# Patient Record
Sex: Male | Born: 1970 | Hispanic: No | Marital: Married | Smoking: Never smoker
Health system: Southern US, Community
[De-identification: ages and names within clinical notes are randomized; demographics above are authoritative.]

## PROBLEM LIST (undated history)

## (undated) ENCOUNTER — Emergency Department (HOSPITAL_COMMUNITY): Disposition: A | Discharge status: Home / Self Care | Payer: Self-pay

## (undated) DIAGNOSIS — I10 Essential (primary) hypertension: Secondary | ICD-10-CM

## (undated) DIAGNOSIS — K920 Hematemesis: Secondary | ICD-10-CM

## (undated) DIAGNOSIS — F1020 Alcohol dependence, uncomplicated: Secondary | ICD-10-CM

---

## 1999-11-07 ENCOUNTER — Emergency Department (HOSPITAL_COMMUNITY): Admission: EM | Admit: 1999-11-07 | Discharge: 1999-11-07 | Payer: Self-pay | Admitting: *Deleted

## 2004-09-20 ENCOUNTER — Emergency Department (HOSPITAL_COMMUNITY): Admission: EM | Admit: 2004-09-20 | Discharge: 2004-09-21 | Payer: Self-pay | Admitting: Emergency Medicine

## 2017-08-22 ENCOUNTER — Emergency Department (HOSPITAL_COMMUNITY): Payer: Self-pay

## 2017-08-22 ENCOUNTER — Emergency Department (HOSPITAL_COMMUNITY)
Admission: EM | Admit: 2017-08-22 | Discharge: 2017-08-22 | Disposition: A | Discharge status: Home / Self Care | Payer: Self-pay | Attending: Emergency Medicine | Admitting: Emergency Medicine

## 2017-08-22 ENCOUNTER — Encounter (HOSPITAL_COMMUNITY): Payer: Self-pay | Admitting: Emergency Medicine

## 2017-08-22 DIAGNOSIS — F1010 Alcohol abuse, uncomplicated: Secondary | ICD-10-CM | POA: Insufficient documentation

## 2017-08-22 HISTORY — DX: Alcohol dependence, uncomplicated: F10.20

## 2017-08-22 HISTORY — DX: Hematemesis: K92.0

## 2017-08-22 LAB — COMPREHENSIVE METABOLIC PANEL
ALT: 34 U/L (ref 17–63)
AST: 34 U/L (ref 15–41)
Albumin: 4.3 g/dL (ref 3.5–5.0)
Alkaline Phosphatase: 67 U/L (ref 38–126)
Anion gap: 10 (ref 5–15)
BUN: 22 mg/dL — ABNORMAL HIGH (ref 6–20)
CO2: 23 mmol/L (ref 22–32)
Calcium: 8.5 mg/dL — ABNORMAL LOW (ref 8.9–10.3)
Chloride: 107 mmol/L (ref 101–111)
Creatinine, Ser: 0.91 mg/dL (ref 0.61–1.24)
GFR calc Af Amer: >60 mL/min (ref >60)
GFR calc non Af Amer: >60 mL/min (ref >60)
Glucose, Bld: 121 mg/dL — ABNORMAL HIGH (ref 65–99)
Potassium: 3.3 mmol/L — ABNORMAL LOW (ref 3.5–5.1)
Sodium: 140 mmol/L (ref 135–145)
Total Bilirubin: 0.6 mg/dL (ref 0.3–1.2)
Total Protein: 7.5 g/dL (ref 6.5–8.1)

## 2017-08-22 LAB — TYPE AND SCREEN
ABO/RH(D): A POS
Antibody Screen: NEGATIVE

## 2017-08-22 LAB — CBC WITH DIFFERENTIAL/PLATELET
Basophils Absolute: 0 10*3/uL (ref 0.0–0.1)
Basophils Relative: 0 %
Eosinophils Absolute: 0.2 10*3/uL (ref 0.0–0.7)
Eosinophils Relative: 1 %
HCT: 43.3 % (ref 39.0–52.0)
Hemoglobin: 15.3 g/dL (ref 13.0–17.0)
Lymphocytes Relative: 15 %
Lymphs Abs: 2.6 10*3/uL (ref 0.7–4.0)
MCH: 32.5 pg (ref 26.0–34.0)
MCHC: 35.3 g/dL (ref 30.0–36.0)
MCV: 91.9 fL (ref 78.0–100.0)
Monocytes Absolute: 0.8 10*3/uL (ref 0.1–1.0)
Monocytes Relative: 5 %
Neutro Abs: 13.1 10*3/uL — ABNORMAL HIGH (ref 1.7–7.7)
Neutrophils Relative %: 79 %
Platelets: 175 10*3/uL (ref 150–400)
RBC: 4.71 MIL/uL (ref 4.22–5.81)
RDW: 13.4 % (ref 11.5–15.5)
WBC: 16.7 10*3/uL — ABNORMAL HIGH (ref 4.0–10.5)

## 2017-08-22 LAB — POC OCCULT BLOOD, ED: Fecal Occult Bld: NEGATIVE

## 2017-08-22 LAB — ETHANOL: Alcohol, Ethyl (B): 300 mg/dL — ABNORMAL HIGH (ref <10)

## 2017-08-22 LAB — ACETAMINOPHEN LEVEL: Acetaminophen (Tylenol), Serum: <10 ug/mL — ABNORMAL LOW (ref 10–30)

## 2017-08-22 LAB — ABO/RH: ABO/RH(D): A POS

## 2017-08-22 LAB — SALICYLATE LEVEL: Salicylate Lvl: <7 mg/dL (ref 2.8–30.0)

## 2017-08-22 MED ORDER — ONDANSETRON HCL 4 MG/2ML IJ SOLN
4.0000 mg | Freq: Once | INTRAMUSCULAR | Status: AC
  Administered 2017-08-22: 4 mg via INTRAVENOUS
  Filled 2017-08-22: qty 2

## 2017-08-22 MED ORDER — HALOPERIDOL LACTATE 5 MG/ML IJ SOLN
2.0000 mg | Freq: Once | INTRAMUSCULAR | Status: AC
  Administered 2017-08-22: 2 mg via INTRAVENOUS
  Filled 2017-08-22: qty 1

## 2017-08-22 MED ORDER — FAMOTIDINE 20 MG PO TABS
20.0000 mg | ORAL_TABLET | Freq: Two times a day (BID) | ORAL | 0 refills | Status: DC
Start: 2017-08-22 — End: 2022-04-17

## 2017-08-22 MED ORDER — HALOPERIDOL LACTATE 5 MG/ML IJ SOLN
2.0000 mg | Freq: Once | INTRAMUSCULAR | Status: AC
  Administered 2017-08-22: 2 mg via INTRAVENOUS

## 2017-08-22 MED ORDER — SODIUM CHLORIDE 0.9 % IV BOLUS (SEPSIS)
1000.0000 mL | Freq: Once | INTRAVENOUS | Status: AC
  Administered 2017-08-22: 1000 mL via INTRAVENOUS

## 2017-08-22 MED ORDER — HALOPERIDOL LACTATE 5 MG/ML IJ SOLN
INTRAMUSCULAR | Status: AC
  Filled 2017-08-22: qty 1

## 2017-08-22 NOTE — ED Triage Notes (Signed)
Pt BIB EMS from home with complaints of vomiting blood and being highly intoxicated. Family called out of concern for patient vomiting blood. Patient known to the ED for same complaints. Pt is combative and intoxicated on arrival.

## 2017-08-22 NOTE — ED Notes (Signed)
Bed: [facility]ALD Expected date:  Expected time:  Means of arrival:  Comments: 

## 2017-08-22 NOTE — ED Notes (Signed)
Bed: ZO10 Expected date:  Expected time:  Means of arrival:  Comments: 46 m vomiting blood

## 2017-08-22 NOTE — ED Provider Notes (Addendum)
Assumed care in sign out. Patient arrived highly intoxicated. Has a past history of alcohol abuse. Some hematemesis reported. No reported history of varices. He is not anticoagulated. H/H is normal. I suspect this may be a small Mallory-Weiss tear and not a significant GI bleed.is not tachycardic. Blood pressure soft, but he initially arrived hypertensive and lower recorded blood pressures were while sleeping/after sedation. He was ambulated prior to discharge with a steady gait. He has no further complaints. Daughter at bedside and to take home. Advised to abstain from etoh or, at least, drink in moderation.   It has been determined that no acute conditions requiring further emergency intervention are present at this time. The patient has been advised of the diagnosis and plan. I reviewed any labs and imaging including any potential incidental findings. We have discussed signs and symptoms that warrant return to the ED and they are listed in the discharge instructions.       Raeford Razor, MD 08/22/17 [phone]

## 2017-08-22 NOTE — ED Notes (Signed)
Pt was O2 sat was 88-90 pt would not tolerate a [place] and blow by was initiated and sats returned to 94-95%.

## 2017-08-22 NOTE — ED Notes (Signed)
Found patient wandering in the hallway looking for a restroom. Patient has steady gait and ambulated on his own. Patient pulled out IV, new access to be established.

## 2017-08-31 NOTE — ED Provider Notes (Signed)
[facility]-EMERGENCY DEPT Provider Note   CSN: [id number] Arrival date & time: 08/22/17  0219     History   Chief Complaint Chief Complaint  Patient presents with  . Hematemesis  . Alcohol Problem    HPI Walter Daniel is a 46 y.o. male.  The history is provided by the patient.  Alcohol Problem  This is a chronic problem. The current episode started more than 1 week ago. The problem occurs constantly. The problem has not changed since onset.Pertinent negatives include no chest pain, no abdominal pain, no headaches and no shortness of breath. Nothing aggravates the symptoms. Nothing relieves the symptoms. He has tried nothing for the symptoms. The treatment provided no relief.    Past Medical History:  Diagnosis Date  . Alcohol dependence (HCC)   . Hematemesis     There are no active problems to display for this patient.   No past surgical history on file.     Home Medications    Prior to Admission medications   Medication Sig Start Date End Date Taking? Authorizing Provider  famotidine (PEPCID) 20 MG tablet Take 1 tablet (20 mg total) by mouth 2 (two) times daily. 08/22/17   Epiphany Seltzer, MD    Family History No family history on file.  Social History Social History  Substance Use Topics  . Smoking status: Not on file  . Smokeless tobacco: Not on file  . Alcohol use Not on file     Allergies   Patient has no known allergies.   Review of Systems Review of Systems  Constitutional: Negative for fever.  Respiratory: Negative for shortness of breath.   Cardiovascular: Negative for chest pain.  Gastrointestinal: Negative for abdominal pain.  Neurological: Negative for headaches.  Psychiatric/Behavioral: Negative for self-injury, sleep disturbance and suicidal ideas.  All other systems reviewed and are negative.    Physical Exam Updated Vital Signs BP 101/66   Pulse 60   Resp 19   SpO2 98%   Physical Exam    Constitutional: He appears well-developed and well-nourished. No distress.  HENT:  Head: Normocephalic and atraumatic.  Mouth/Throat: No oropharyngeal exudate.  Eyes: Pupils are equal, round, and reactive to light. Conjunctivae are normal.  Neck: Normal range of motion. Neck supple.  Cardiovascular: Normal rate, regular rhythm, normal heart sounds and intact distal pulses.   Pulmonary/Chest: Effort normal and breath sounds normal.  Abdominal: Soft. Bowel sounds are normal. He exhibits no mass. There is no tenderness. There is no rebound and no guarding.  Musculoskeletal: Normal range of motion.  Neurological: He is alert. He displays normal reflexes.  Skin: Skin is warm. Capillary refill takes less than 2 seconds.  Psychiatric: He has a normal mood and affect.     ED Treatments / Results   Vitals:   08/22/17 0830 08/22/17 0900  BP: 107/78 101/66  Pulse: 65 60  Resp:  19  SpO2: 94% 98%    Labs (all labs ordered are listed, but only abnormal results are displayed)  Results for orders placed or performed during the hospital encounter of 08/22/17  CBC with Differential/Platelet  Result Value Ref Range   WBC 16.7 (H) 4.0 - 10.5 K/uL   RBC 4.71 4.22 - 5.81 MIL/uL   Hemoglobin 15.3 13.0 - 17.0 g/dL   HCT 32.443.3 40.139.0 - 02.752.0 %   MCV 91.9 78.0 - 100.0 fL   MCH 32.5 26.0 - 34.0 pg   MCHC 35.3 30.0 - 36.0 g/dL   RDW  13.4 11.5 - 15.5 %   Platelets 175 150 - 400 K/uL   Neutrophils Relative % 79 %   Neutro Abs 13.1 (H) 1.7 - 7.7 K/uL   Lymphocytes Relative 15 %   Lymphs Abs 2.6 0.7 - 4.0 K/uL   Monocytes Relative 5 %   Monocytes Absolute 0.8 0.1 - 1.0 K/uL   Eosinophils Relative 1 %   Eosinophils Absolute 0.2 0.0 - 0.7 K/uL   Basophils Relative 0 %   Basophils Absolute 0.0 0.0 - 0.1 K/uL  Comprehensive metabolic panel  Result Value Ref Range   Sodium 140 135 - 145 mmol/L   Potassium 3.3 (L) 3.5 - 5.1 mmol/L   Chloride 107 101 - 111 mmol/L   CO2 23 22 - 32 mmol/L   Glucose,  Bld 121 (H) 65 - 99 mg/dL   BUN 22 (H) 6 - 20 mg/dL   Creatinine, Ser 4.54 0.61 - 1.24 mg/dL   Calcium 8.5 (L) 8.9 - 10.3 mg/dL   Total Protein 7.5 6.5 - 8.1 g/dL   Albumin 4.3 3.5 - 5.0 g/dL   AST 34 15 - 41 U/L   ALT 34 17 - 63 U/L   Alkaline Phosphatase 67 38 - 126 U/L   Total Bilirubin 0.6 0.3 - 1.2 mg/dL   GFR calc non Af Amer >60 >60 mL/min   GFR calc Af Amer >60 >60 mL/min   Anion gap 10 5 - 15  Ethanol  Result Value Ref Range   Alcohol, Ethyl (B) 300 (H) <10 mg/dL  Salicylate level  Result Value Ref Range   Salicylate Lvl <7.0 2.8 - 30.0 mg/dL  Acetaminophen level  Result Value Ref Range   Acetaminophen (Tylenol), Serum <10 (L) 10 - 30 ug/mL  POC occult blood, ED  Result Value Ref Range   Fecal Occult Bld NEGATIVE NEGATIVE  Type and screen  Result Value Ref Range   ABO/RH(D) A POS    Antibody Screen NEG    Sample Expiration 08/25/2017   ABO/Rh  Result Value Ref Range   ABO/RH(D) A POS    Dg Abdomen Acute W/chest  Result Date: 08/22/2017 CLINICAL DATA:  Initial evaluation for acute hematemesis. EXAM: DG ABDOMEN ACUTE W/ 1V CHEST COMPARISON:  None. FINDINGS: Patient is markedly rotated to the right. Allowing for rotation, cardiac and mediastinal silhouettes are grossly within normal limits. Lungs normally inflated. No focal infiltrates. No pulmonary edema or pleural effusion. No appreciable pneumothorax on this supine radiograph. Bowel gas pattern within normal limits without obstruction or ileus. No abnormal bowel wall thickening. No appreciable free air on decubitus view. No soft tissue mass or abnormal calcification. Vascular phleboliths overlie the pelvis. No acute osseus abnormality. IMPRESSION: 1. Nonobstructive bowel gas pattern with no radiographic evidence for acute intra-abdominal pathology. 2. No active cardiopulmonary disease. Electronically Signed   By: Rise Mu M.D.   On: 08/22/2017 06:42    EKG  EKG Interpretation  Date/Time:  Sunday August 22 2017 03:04:32 EDT Ventricular Rate:  68 PR Interval:    QRS Duration: 121 QT Interval:  383 QTC Calculation: 408 R Axis:   98 Text Interpretation:  Sinus rhythm Probable inferior infarct, age indeterminate Confirmed by Nicanor Alcon, Briana Newman ([id number]) on 08/22/2017 5:03:14 AM       Radiology No results found.  Procedures Procedures (including critical care time)  Medications Ordered in ED Medications  ondansetron (ZOFRAN) injection 4 mg (4 mg Intravenous Given 08/22/17 0505)  sodium chloride 0.9 % bolus 1,000 mL (0 mLs  Intravenous Stopped 08/22/17 0545)  haloperidol lactate (HALDOL) injection 2 mg (2 mg Intravenous Not Given 08/22/17 0600)  haloperidol lactate (HALDOL) injection 2 mg (2 mg Intravenous Given 08/22/17 0545)       Final Clinical Impressions(s) / ED Diagnoses   Final diagnoses:  Alcohol abuse   Signed out to Dr. Juleen China pending sober up PO challenge and walk.    New Prescriptions Discharge Medication List as of 08/22/2017  9:30 AM    START taking these medications   Details  famotidine (PEPCID) 20 MG tablet Take 1 tablet (20 mg total) by mouth 2 (two) times daily., Starting Sun 08/22/2017, Print         Nicanor Alcon, Grace Haggart, MD 08/31/17 2359

## 2019-05-02 ENCOUNTER — Emergency Department (HOSPITAL_COMMUNITY)
Admission: EM | Admit: 2019-05-02 | Discharge: 2019-05-03 | Disposition: A | Discharge status: Home / Self Care | Payer: Self-pay | Attending: Emergency Medicine | Admitting: Emergency Medicine

## 2019-05-02 ENCOUNTER — Encounter (HOSPITAL_COMMUNITY): Payer: Self-pay | Admitting: Emergency Medicine

## 2019-05-02 DIAGNOSIS — R040 Epistaxis: Secondary | ICD-10-CM | POA: Insufficient documentation

## 2019-05-02 DIAGNOSIS — R030 Elevated blood-pressure reading, without diagnosis of hypertension: Secondary | ICD-10-CM

## 2019-05-02 DIAGNOSIS — I10 Essential (primary) hypertension: Secondary | ICD-10-CM | POA: Insufficient documentation

## 2019-05-02 HISTORY — DX: Essential (primary) hypertension: I10

## 2019-05-02 NOTE — ED Triage Notes (Signed)
BIB EMS from [facility]. Pt was having dinner when his nose started bleeding, has been going off/on X2 days. Hypertensive, has history of same.

## 2019-05-03 MED ORDER — LISINOPRIL-HYDROCHLOROTHIAZIDE 10-12.5 MG PO TABS: 1.0000 | ORAL_TABLET | Freq: Every day | ORAL | 30 refills | Status: DC

## 2019-05-03 MED ORDER — SALINE SPRAY 0.65 % NA SOLN: 1.0000 | NASAL | 0 refills | Status: DC | PRN

## 2019-05-03 MED ORDER — HYDRALAZINE HCL 20 MG/ML IJ SOLN
10.0000 mg | INTRAMUSCULAR | Status: AC
  Administered 2019-05-03: 10 mg via INTRAVENOUS
  Filled 2019-05-03: qty 1

## 2019-05-03 NOTE — ED Provider Notes (Signed)
[facility]   CSN: [id number] Arrival date & time: 05/02/19  2233     History   Chief Complaint Chief Complaint  Patient presents with   Epistaxis    HPI [facility] Ltd[facility] Luis Rockwell AlexandriaVillalobos Daniel is a 48 y.o. male.     Patient with hx of HTN presents to the ED with a chief complaint of nosebleed.  He states that he has been having intermittent nosebleeds for the past couple of days.  He denies any trauma.  He denies any other associated symptoms.  He does not take any medications.  The history is provided by the patient. No language interpreter was used.    Past Medical History:  Diagnosis Date   Hypertension     There are no active problems to display for this patient.   History reviewed. No pertinent surgical history.      Home Medications    Prior to Admission medications   Not on File    Family History No family history on file.  Social History Social History   Tobacco Use   Smoking status: Never Smoker   Smokeless tobacco: Never Used  Substance Use Topics   Alcohol use: Yes    Comment: Socially    Drug use: Not Currently     Allergies   Patient has no known allergies.   Review of Systems Review of Systems  All other systems reviewed and are negative.    Physical Exam Updated Vital Signs BP (!) 226/119 (BP Location: Right Arm)    Pulse 65    Temp 97.7 F (36.5 C) (Oral)    Resp 18    Ht 5\' 9"  (1.753 m)    Wt 90.7 kg    SpO2 97%    BMI 29.53 kg/m   Physical Exam Vitals signs and nursing note reviewed.  Constitutional:      Appearance: He is well-developed.  HENT:     Head: Normocephalic and atraumatic.     Nose:     Comments: No active bleeding Eyes:     Conjunctiva/sclera: Conjunctivae normal.  Neck:     Musculoskeletal: Neck supple.  Cardiovascular:     Rate and Rhythm: Normal rate and regular rhythm.     Heart sounds: No murmur.  Pulmonary:     Effort: Pulmonary effort is  normal. No respiratory distress.     Breath sounds: Normal breath sounds.  Abdominal:     Palpations: Abdomen is soft.     Tenderness: There is no abdominal tenderness.  Skin:    General: Skin is warm and dry.  Neurological:     Mental Status: He is alert and oriented to person, place, and time.  Psychiatric:        Mood and Affect: Mood normal.        Behavior: Behavior normal.        Thought Content: Thought content normal.        Judgment: Judgment normal.      ED Treatments / Results  Labs (all labs ordered are listed, but only abnormal results are displayed) Labs Reviewed - No data to display  EKG    Radiology No results found.  Procedures Procedures (including critical care time)  Medications Ordered in ED Medications  hydrALAZINE (APRESOLINE) injection 10 mg (has no administration in time range)     Initial Impression / Assessment and Plan / ED Course  I have reviewed the triage vital signs and the nursing notes.  Pertinent labs & imaging results that were available during my care of the patient were reviewed by me and considered in my medical decision making (see chart for details).        Patient with intermittent epistaxis over the past few days.  Noted to be extremely hypertensive in triage.  Denies any trauma to his nose.  He is not anticoagulated.  He is not bleeding now.  Will give hydralazine to bring down pressure in ED.  Will start on dual therapy.  Will given nasal saline.  PCP follow-up.  Return precautions discussed.  Final Clinical Impressions(s) / ED Diagnoses   Final diagnoses:  Epistaxis  Elevated blood pressure reading    ED Discharge Orders         Ordered    lisinopril-hydrochlorothiazide (ZESTORETIC) 10-12.5 MG tablet  Daily     05/03/19 0553    sodium chloride (OCEAN) 0.65 % SOLN nasal spray  As needed     05/03/19 0554           Montine Circle, PA-C 05/03/19 [id number]    Dina Rich, Barbette Hair, MD 05/03/19 0630

## 2019-05-03 NOTE — Discharge Instructions (Signed)
°  Se cree que su hemorragia nasal es causada por su presin arterial alta. Esto puede hacer que los pequeos vasos sanguneos se rompan y Corporate investment banker. Le han recetado medicamentos para la presin arterial. Por favor, tmalo segn las indicaciones. Esto debera ayudar a detener las hemorragias nasales. Tambin debe usar el aerosol nasal para ayudar a Adult nurse mucosa nasal. Regrese a la sala de emergencias si sus sntomas empeoran.  Your nosebleed is thought to be caused by your high blood pressure.  This can cause the small blood vessels to break and bleed.  You have been prescribed blood pressure medication.  Please take it as directed.  This should help stop the nosebleeds.  You also need to use the nasal spray to help moisten the nasal mucosa.  Return to the emergency room if your symptoms worsen.

## 2019-05-15 ENCOUNTER — Emergency Department (HOSPITAL_COMMUNITY): Payer: Self-pay

## 2019-05-15 ENCOUNTER — Other Ambulatory Visit: Payer: Self-pay

## 2019-05-15 ENCOUNTER — Encounter (HOSPITAL_COMMUNITY): Payer: Self-pay

## 2019-05-15 ENCOUNTER — Emergency Department (HOSPITAL_COMMUNITY)
Admission: EM | Admit: 2019-05-15 | Discharge: 2019-05-16 | Disposition: A | Discharge status: Home / Self Care | Payer: Self-pay | Attending: Emergency Medicine | Admitting: Emergency Medicine

## 2019-05-15 DIAGNOSIS — F419 Anxiety disorder, unspecified: Secondary | ICD-10-CM | POA: Insufficient documentation

## 2019-05-15 DIAGNOSIS — Z20828 Contact with and (suspected) exposure to other viral communicable diseases: Secondary | ICD-10-CM | POA: Insufficient documentation

## 2019-05-15 DIAGNOSIS — Z9114 Patient's other noncompliance with medication regimen: Secondary | ICD-10-CM | POA: Insufficient documentation

## 2019-05-15 DIAGNOSIS — I10 Essential (primary) hypertension: Secondary | ICD-10-CM | POA: Insufficient documentation

## 2019-05-15 DIAGNOSIS — R0789 Other chest pain: Secondary | ICD-10-CM | POA: Insufficient documentation

## 2019-05-15 DIAGNOSIS — Z79899 Other long term (current) drug therapy: Secondary | ICD-10-CM | POA: Insufficient documentation

## 2019-05-15 LAB — CBC
HCT: 44.3 % (ref 39.0–52.0)
Hemoglobin: 15.3 g/dL (ref 13.0–17.0)
MCH: 32.1 pg (ref 26.0–34.0)
MCHC: 34.5 g/dL (ref 30.0–36.0)
MCV: 92.9 fL (ref 80.0–100.0)
Platelets: 163 10*3/uL (ref 150–400)
RBC: 4.77 MIL/uL (ref 4.22–5.81)
RDW: 12.8 % (ref 11.5–15.5)
WBC: 9.5 10*3/uL (ref 4.0–10.5)
nRBC: 0 % (ref 0.0–0.2)

## 2019-05-15 NOTE — ED Triage Notes (Signed)
Pt arrives POV for eval of chest pain and "feeling off". Pt states that he was here last week for HTN and started on new BP. Pt stated that he doesn't like how the med made him feel so he "just took some parts of it". Pt now hypertensive on arrival and endorses some chest pain and abd discomfort. Denies HA, blurry vision.

## 2019-05-16 ENCOUNTER — Encounter (HOSPITAL_COMMUNITY): Payer: Self-pay

## 2019-05-16 LAB — BASIC METABOLIC PANEL
Anion gap: 10 (ref 5–15)
BUN: 13 mg/dL (ref 6–20)
CO2: 21 mmol/L — ABNORMAL LOW (ref 22–32)
Calcium: 9.2 mg/dL (ref 8.9–10.3)
Chloride: 108 mmol/L (ref 98–111)
Creatinine, Ser: 0.92 mg/dL (ref 0.61–1.24)
GFR calc Af Amer: >60 mL/min (ref >60)
GFR calc non Af Amer: >60 mL/min (ref >60)
Glucose, Bld: 111 mg/dL — ABNORMAL HIGH (ref 70–99)
Potassium: 3.8 mmol/L (ref 3.5–5.1)
Sodium: 139 mmol/L (ref 135–145)

## 2019-05-16 LAB — SARS CORONAVIRUS 2 BY RT PCR (HOSPITAL ORDER, PERFORMED IN ~~LOC~~ HOSPITAL LAB): SARS Coronavirus 2: NEGATIVE

## 2019-05-16 LAB — TROPONIN I (HIGH SENSITIVITY)
Troponin I (High Sensitivity): 7 ng/L (ref <18)
Troponin I (High Sensitivity): 10 ng/L (ref <18)

## 2019-05-16 LAB — ETHANOL: Alcohol, Ethyl (B): <10 mg/dL (ref <10)

## 2019-05-16 MED ORDER — HYDRALAZINE HCL 20 MG/ML IJ SOLN
10.0000 mg | Freq: Once | INTRAMUSCULAR | Status: AC
  Administered 2019-05-16: 10 mg via INTRAVENOUS
  Filled 2019-05-16: qty 1

## 2019-05-16 MED ORDER — LORAZEPAM 2 MG/ML IJ SOLN
1.0000 mg | Freq: Once | INTRAMUSCULAR | Status: AC
  Administered 2019-05-16: 1 mg via INTRAVENOUS
  Filled 2019-05-16: qty 1

## 2019-05-16 MED ORDER — ASPIRIN 81 MG PO CHEW
324.0000 mg | CHEWABLE_TABLET | Freq: Once | ORAL | Status: AC
  Administered 2019-05-16: 02:00:00 324 mg via ORAL
  Filled 2019-05-16: qty 4

## 2019-05-16 NOTE — ED Provider Notes (Signed)
[facility] EMERGENCY DEPARTMENT Provider Note   CSN: [id number] Arrival date & time: 05/15/19  2053    History   Chief Complaint Chief Complaint  Patient presents with  . Hypertension    HPI Siddarth Hsiung is a 48 y.o. male.     HPI  This is a 48 year old male with a history of alcohol abuse who presents with concerns for high blood pressure.  Patient reports that he was seen here over a week ago and diagnosed with high blood pressure.  He was discharged with blood pressure medications.  He states he has felt well throughout the week but noted his blood pressure was higher earlier today.  He developed pressure like chest pain and a feeling of "feeling off."  He states that he has taken his blood pressure medicines on and off for last week but they make him feel funny.  He rates his pain at 7 out of 10.  Denies headache or blurry vision.  Denies shortness of breath, cough, fevers.  Denies history of diabetes, hyperlipidemia, family history of heart disease.  History taken with Spanish interpreter.  Past Medical History:  Diagnosis Date  . Alcohol dependence ([place])   . Hematemesis     There are no active problems to display for this patient.   History reviewed. No pertinent surgical history.      Home Medications    Prior to Admission medications   Medication Sig Start Date End Date Taking? Authorizing Provider  famotidine (PEPCID) 20 MG tablet Take 1 tablet (20 mg total) by mouth 2 (two) times daily. 08/22/17   Palumbo, April, MD    Family History History reviewed. No pertinent family history.  Social History Social History   Tobacco Use  . Smoking status: Not on file  Substance Use Topics  . Alcohol use: Not on file  . Drug use: Not on file     Allergies   Patient has no known allergies.   Review of Systems Review of Systems  Constitutional: Negative.  Negative for fever.  Respiratory: Positive for chest tightness. Negative  for shortness of breath.   Cardiovascular: Negative.  Negative for chest pain and leg swelling.  Gastrointestinal: Negative.  Negative for abdominal pain, nausea and vomiting.  Genitourinary: Negative.  Negative for dysuria.  Musculoskeletal: Negative for back pain.  Skin: Negative for rash.  Neurological: Negative for headaches.  All other systems reviewed and are negative.    Physical Exam Updated Vital Signs BP 129/73   Pulse 71   Temp (!) 97.5 F (36.4 C) (Oral)   Resp 11   Ht 1.753 m (5\' 9" )   Wt 90.7 kg   SpO2 98%   BMI 29.53 kg/m   Physical Exam Vitals signs and nursing note reviewed.  Constitutional:      Appearance: He is well-developed.  HENT:     Head: Normocephalic and atraumatic.  Eyes:     Pupils: Pupils are equal, round, and reactive to light.  Neck:     Musculoskeletal: Neck supple.  Cardiovascular:     Rate and Rhythm: Normal rate and regular rhythm.     Heart sounds: Normal heart sounds. No murmur.  Pulmonary:     Effort: Pulmonary effort is normal. No respiratory distress.     Breath sounds: Normal breath sounds. No wheezing.  Abdominal:     General: Bowel sounds are normal.     Palpations: Abdomen is soft.     Tenderness: There is no abdominal  tenderness. There is no rebound.  Musculoskeletal:     Right lower leg: No edema.     Left lower leg: No edema.  Lymphadenopathy:     Cervical: No cervical adenopathy.  Skin:    General: Skin is warm and dry.  Neurological:     Mental Status: He is alert and oriented to person, place, and time.  Psychiatric:        Mood and Affect: Mood normal.      ED Treatments / Results  Labs (all labs ordered are listed, but only abnormal results are displayed) Labs Reviewed  BASIC METABOLIC PANEL - Abnormal; Notable for the following components:      Result Value   CO2 21 (*)    Glucose, Bld 111 (*)    All other components within normal limits  SARS CORONAVIRUS 2 (HOSPITAL ORDER, PERFORMED IN [facility])  CBC  TROPONIN I (HIGH SENSITIVITY)  TROPONIN I (HIGH SENSITIVITY)  ETHANOL    EKG EKG Interpretation  Date/Time:  Tuesday May 16 2019 01:06:19 EDT Ventricular Rate:  85 PR Interval:    QRS Duration: 106 QT Interval:  416 QTC Calculation: 495 R Axis:   71 Text Interpretation:  Sinus rhythm Probable left atrial enlargement Left ventricular hypertrophy Borderline prolonged QT interval Confirmed by Ross MarcusHorton, Kariya Lavergne ([id number]) on 05/16/2019 1:09:07 AM   Radiology Dg Chest 2 View  Result Date: 05/15/2019 CLINICAL DATA:  Epigastric pain and shortness of breath for 1 day EXAM: CHEST - 2 VIEW COMPARISON:  08/22/2017 FINDINGS: The heart size and mediastinal contours are within normal limits. Both lungs are clear. The visualized skeletal structures are unremarkable. IMPRESSION: No active cardiopulmonary disease. Electronically Signed   By: Alcide CleverMark  Lukens M.D.   On: 05/15/2019 22:23    Procedures Procedures (including critical care time)  Medications Ordered in ED Medications  LORazepam (ATIVAN) injection 1 mg (1 mg Intravenous Given 05/16/19 0209)  hydrALAZINE (APRESOLINE) injection 10 mg (10 mg Intravenous Given 05/16/19 0209)  aspirin chewable tablet 324 mg (324 mg Oral Given 05/16/19 0207)     Initial Impression / Assessment and Plan / ED Course  I have reviewed the triage vital signs and the nursing notes.  Pertinent labs & imaging results that were available during my care of the patient were reviewed by me and considered in my medical decision making (see chart for details).        Patient presents with high blood pressure.  Reports chest pain at home.  EKG shows no evidence of acute ischemia or arrhythmia.  Pain is somewhat atypical.  He is severely hypertensive initially with blood pressures of 200s over 100s.  Patient was given hydralazine, aspirin, and Ativan.  He has a known history of drinking but reports that he has not had a drink recently.  He does appear  anxious.  Troponin x2 is negative and delta troponin is negative.  Heart score is 2 for age and risk factors.  Blood pressure improved dramatically while in the emergency department.  And symptoms have improved.  Does not appear to be in hypertensive urgency or emergency.  He was placed on lisinopril and HCTZ during his last evaluation.  I have encouraged him to continue this.  He was provided with cardiology follow-up as well as follow-up with [facility].  After history, exam, and medical workup I feel the patient has been appropriately medically screened and is safe for discharge home. Pertinent diagnoses were discussed with the patient. Patient was  given return precautions.   Final Clinical Impressions(s) / ED Diagnoses   Final diagnoses:  Essential hypertension  Atypical chest pain    ED Discharge Orders    None       Shon BatonHorton, Reynald Woods F, MD 05/16/19 [phone]

## 2019-05-16 NOTE — Discharge Instructions (Signed)
It is important you take your medications as prescribed.  Follow-up with [facility] for recheck of your blood pressure.

## 2019-05-16 NOTE — ED Notes (Signed)
ED Provider at bedside. 

## 2019-05-16 NOTE — ED Notes (Signed)
Spanish video interpreter Glass blower/designer) was used for d/c instructions

## 2019-05-18 ENCOUNTER — Other Ambulatory Visit: Payer: Self-pay

## 2019-05-18 ENCOUNTER — Emergency Department (HOSPITAL_COMMUNITY)
Admission: EM | Admit: 2019-05-18 | Discharge: 2019-05-18 | Disposition: A | Discharge status: Home / Self Care | Payer: Self-pay | Attending: Emergency Medicine | Admitting: Emergency Medicine

## 2019-05-18 ENCOUNTER — Encounter (HOSPITAL_COMMUNITY): Payer: Self-pay | Admitting: Emergency Medicine

## 2019-05-18 DIAGNOSIS — I10 Essential (primary) hypertension: Secondary | ICD-10-CM | POA: Insufficient documentation

## 2019-05-18 MED ORDER — LISINOPRIL-HYDROCHLOROTHIAZIDE 10-12.5 MG PO TABS: 1.0000 | ORAL_TABLET | Freq: Every day | ORAL | 0 refills | Status: DC

## 2019-05-18 NOTE — ED Notes (Signed)
Pt given discharge instructions and follow up information. Pt educated on the need to take blood pressure medication. Pt verbalized understanding. Pt discharged from the ED without incident.

## 2019-05-18 NOTE — Discharge Instructions (Addendum)
Le he recetado medicina para bajar su presion, por favor tome esta medicina todos los dias hasta que su presion se normalize. Le he dado el numero de un doctor, hago una cita para establezer un doctor de cabezera.

## 2019-05-18 NOTE — ED Provider Notes (Signed)
[facility]   CSN: [id number] Arrival date & time: 05/18/19  1530    History   Chief Complaint Chief Complaint  Patient presents with  . Hypertension    HPI Walter BreslowJose Luis Daniel is a 48 y.o. male.     48 y.o male with a PMH og hypertension presents to the ED requesting hypertensive medication control.  Patient was seen here for the last 2 times, was given a prescription for famotidine, thought he likely was suffering from GERD.  He was also given a prescription for blood pressure medication, he has not filled this prescription, has not taken this medication.  Today he arrived in the ED hypertensive, reports he has not been taking his medication aside from the famotidine.  Patient was sent in by his PCP who told him to come to the ED for blood pressure medication.  Patient denies any dizziness, headache, chest pain, shortness of breath or any acute process at this time. No translator was used, I spoke to patient personally in [place].  The history is provided by the patient and medical records.  Hypertension This is a new problem. Pertinent negatives include no chest pain, no abdominal pain, no headaches and no shortness of breath.    Past Medical History:  Diagnosis Date  . Alcohol dependence (HCC)   . Hematemesis   . Hypertension     There are no active problems to display for this patient.   History reviewed. No pertinent surgical history.      Home Medications    Prior to Admission medications   Medication Sig Start Date End Date Taking? Authorizing Provider  famotidine (PEPCID) 20 MG tablet Take 1 tablet (20 mg total) by mouth 2 (two) times daily. 08/22/17   Palumbo, April, MD  lisinopril-hydrochlorothiazide (ZESTORETIC) 10-12.5 MG tablet Take 1 tablet by mouth daily. 05/18/19 06/17/19  Claude MangesSoto, Trigo Winterbottom, PA-C  sodium chloride (OCEAN) 0.65 % SOLN nasal spray Place 1 spray into both nostrils as needed for congestion.  05/03/19   Roxy HorsemanBrowning, Robert, PA-C    Family History No family history on file.  Social History Social History   Tobacco Use  . Smoking status: Never Smoker  . Smokeless tobacco: Never Used  Substance Use Topics  . Alcohol use: Yes    Comment: Socially   . Drug use: Not Currently     Allergies   Patient has no known allergies.   Review of Systems Review of Systems  Constitutional: Negative for fever.  Respiratory: Negative for chest tightness and shortness of breath.   Cardiovascular: Negative for chest pain.  Gastrointestinal: Negative for abdominal pain.  Neurological: Negative for headaches.     Physical Exam Updated Vital Signs BP (!) 183/109   Pulse 64   Temp (!) 97.5 F (36.4 C) (Oral)   Resp 16   SpO2 100%   Physical Exam Vitals signs and nursing note reviewed.  Constitutional:      Appearance: He is well-developed.     Comments: Non-ill-appearing, on his cell phone during interview.  HENT:     Head: Normocephalic and atraumatic.  Eyes:     General: No scleral icterus.    Pupils: Pupils are equal, round, and reactive to light.  Neck:     Musculoskeletal: Normal range of motion.  Cardiovascular:     Heart sounds: Normal heart sounds.  Pulmonary:     Effort: Pulmonary effort is normal.     Breath sounds: Normal breath sounds. No wheezing.  Chest:     Chest wall: No tenderness.  Abdominal:     General: Bowel sounds are normal. There is no distension.     Palpations: Abdomen is soft.     Tenderness: There is no abdominal tenderness.  Musculoskeletal:        General: No tenderness or deformity.  Skin:    General: Skin is warm and dry.  Neurological:     Mental Status: He is alert and oriented to person, place, and time.     Comments: Alert, oriented, thought content appropriate. Speech fluent without evidence of aphasia. Able to follow 2 step commands without difficulty.  Cranial Nerves:  II:  Peripheral visual fields grossly normal, pupils,  round, reactive to light III,IV, VI: ptosis not present, extra-ocular motions intact bilaterally  V,VII: smile symmetric, facial light touch sensation equal VIII: hearing grossly normal bilaterally  IX,X: midline uvula rise  XI: bilateral shoulder shrug equal and strong XII: midline tongue extension  Motor:  5/5 in upper and lower extremities bilaterally including strong and equal grip strength and dorsiflexion/plantar flexion Sensory: light touch normal in all extremities.  Cerebellar: normal finger-to-nose with bilateral upper extremities, pronator drift negative Gait: normal gait and balance       ED Treatments / Results  Labs (all labs ordered are listed, but only abnormal results are displayed) Labs Reviewed - No data to display  EKG None  Radiology No results found.  Procedures Procedures (including critical care time)  Medications Ordered in ED Medications - No data to display   Initial Impression / Assessment and Plan / ED Course  I have reviewed the triage vital signs and the nursing notes.  Pertinent labs & imaging results that were available during my care of the patient were reviewed by me and considered in my medical decision making (see chart for details).    Patient with a previous history of high blood pressure presents to the ED for medication to treat his blood pressure.  Seen here the last 2 times for blood pressure concerns, had a prescription for lisinopril along with hydrochlorothiazide combination therapy, reports he never received this medication.  He has been taking famotidine as he was also told that he likely was suffering from some GERD.  During evaluation patient denies any headache, chest pain, shortness of breath, any acute process.  He reports he has establish care with a PCP who he will see next week, was concerned that his blood pressure reading was so elevated.  Doubt any hypertensive urgency at this time.  Will provide patient with his  prescription for his blood pressure medication along with coordinate follow-up with either [facility] or [facility] family [facility].  No translator was used during this encounter as I personally spoken to patient in [place].  Patient understands and agrees with management.  Return precautions provided at length in Spanish.  Portions of this note were generated with [employer]. Dictation errors may occur despite best attempts at proofreading.       Final Clinical Impressions(s) / ED Diagnoses   Final diagnoses:  Essential hypertension    ED Discharge Orders         Ordered    lisinopril-hydrochlorothiazide (ZESTORETIC) 10-12.5 MG tablet  Daily     05/18/19 1712           Janeece Fitting, PA-C 05/18/19 1715    Lacretia Leigh, MD 05/24/19 [phone]

## 2019-05-18 NOTE — ED Triage Notes (Signed)
Pt here for eval of hypertension. Started new medications that have brought it down from 520E to 022V systolic. Denies any pain anywhere, denies any headache, denies dizziness. Denies chest pain.

## 2019-05-22 ENCOUNTER — Emergency Department (HOSPITAL_COMMUNITY)
Admission: EM | Admit: 2019-05-22 | Discharge: 2019-05-22 | Disposition: A | Discharge status: Home / Self Care | Payer: Self-pay | Attending: Emergency Medicine | Admitting: Emergency Medicine

## 2019-05-22 ENCOUNTER — Encounter (HOSPITAL_COMMUNITY): Payer: Self-pay | Admitting: *Deleted

## 2019-05-22 ENCOUNTER — Emergency Department (HOSPITAL_COMMUNITY): Payer: Self-pay

## 2019-05-22 ENCOUNTER — Other Ambulatory Visit: Payer: Self-pay

## 2019-05-22 DIAGNOSIS — R0789 Other chest pain: Secondary | ICD-10-CM | POA: Insufficient documentation

## 2019-05-22 DIAGNOSIS — I159 Secondary hypertension, unspecified: Secondary | ICD-10-CM | POA: Insufficient documentation

## 2019-05-22 DIAGNOSIS — K219 Gastro-esophageal reflux disease without esophagitis: Secondary | ICD-10-CM | POA: Insufficient documentation

## 2019-05-22 LAB — CBC
HCT: 42.3 % (ref 39.0–52.0)
Hemoglobin: 14.7 g/dL (ref 13.0–17.0)
MCH: 32.1 pg (ref 26.0–34.0)
MCHC: 34.8 g/dL (ref 30.0–36.0)
MCV: 92.4 fL (ref 80.0–100.0)
Platelets: 166 10*3/uL (ref 150–400)
RBC: 4.58 MIL/uL (ref 4.22–5.81)
RDW: 12.3 % (ref 11.5–15.5)
WBC: 9.7 10*3/uL (ref 4.0–10.5)
nRBC: 0 % (ref 0.0–0.2)

## 2019-05-22 LAB — BASIC METABOLIC PANEL
Anion gap: 11 (ref 5–15)
BUN: 21 mg/dL — ABNORMAL HIGH (ref 6–20)
CO2: 23 mmol/L (ref 22–32)
Calcium: 9.2 mg/dL (ref 8.9–10.3)
Chloride: 101 mmol/L (ref 98–111)
Creatinine, Ser: 1.03 mg/dL (ref 0.61–1.24)
GFR calc Af Amer: >60 mL/min (ref >60)
GFR calc non Af Amer: >60 mL/min (ref >60)
Glucose, Bld: 113 mg/dL — ABNORMAL HIGH (ref 70–99)
Potassium: 3.3 mmol/L — ABNORMAL LOW (ref 3.5–5.1)
Sodium: 135 mmol/L (ref 135–145)

## 2019-05-22 LAB — TROPONIN I (HIGH SENSITIVITY)
Troponin I (High Sensitivity): 9 ng/L (ref <18)
Troponin I (High Sensitivity): 8 ng/L (ref <18)

## 2019-05-22 MED ORDER — LISINOPRIL 10 MG PO TABS
10.0000 mg | ORAL_TABLET | Freq: Every day | ORAL | Status: DC
  Administered 2019-05-22: 10 mg via ORAL
  Filled 2019-05-22: qty 1

## 2019-05-22 MED ORDER — SODIUM CHLORIDE 0.9% FLUSH: 3.0000 mL | Freq: Once | INTRAVENOUS | Status: DC

## 2019-05-22 MED ORDER — ALUM & MAG HYDROXIDE-SIMETH 200-200-20 MG/5ML PO SUSP
30.0000 mL | Freq: Once | ORAL | Status: AC
  Administered 2019-05-22: 30 mL via ORAL
  Filled 2019-05-22: qty 30

## 2019-05-22 MED ORDER — NITROGLYCERIN 0.4 MG SL SUBL
0.4000 mg | SUBLINGUAL_TABLET | Freq: Once | SUBLINGUAL | Status: AC
  Administered 2019-05-22: 0.4 mg via SUBLINGUAL
  Filled 2019-05-22: qty 1

## 2019-05-22 MED ORDER — HYDROCHLOROTHIAZIDE 12.5 MG PO CAPS
12.5000 mg | ORAL_CAPSULE | Freq: Every day | ORAL | Status: DC
  Administered 2019-05-22: 04:00:00 12.5 mg via ORAL
  Filled 2019-05-22: qty 1

## 2019-05-22 MED ORDER — CLONIDINE HCL 0.1 MG PO TABS
0.1000 mg | ORAL_TABLET | Freq: Once | ORAL | Status: AC
  Administered 2019-05-22: 05:00:00 0.1 mg via ORAL
  Filled 2019-05-22: qty 1

## 2019-05-22 MED ORDER — LISINOPRIL-HYDROCHLOROTHIAZIDE 10-12.5 MG PO TABS: 1.0000 | ORAL_TABLET | Freq: Every day | ORAL | Status: DC

## 2019-05-22 MED ORDER — LIDOCAINE VISCOUS HCL 2 % MT SOLN
15.0000 mL | Freq: Once | OROMUCOSAL | Status: AC
  Administered 2019-05-22: 15 mL via ORAL
  Filled 2019-05-22: qty 15

## 2019-05-22 NOTE — ED Notes (Signed)
PT states understanding of care given, follow up care. PT ambulated from ED to car with a steady gait.  

## 2019-05-22 NOTE — ED Provider Notes (Signed)
[facility]  CSN: [id number] Arrival date & time: 05/22/19 0153  Chief Complaint(s) Chest Pain  HPI Walter Daniel is a 48 y.o. male   The history is provided by the patient.  Chest Pain Pain location:  Substernal area Pain quality: burning   Pain radiates to:  Does not radiate Pain severity:  Moderate Onset quality:  Gradual Duration:  2 days Timing:  Constant Progression:  Waxing and waning Chronicity:  Recurrent Relieved by: upright. Exacerbated by: supine. Associated symptoms: heartburn   Associated symptoms: no abdominal pain, no cough, no fatigue, no fever, no lower extremity edema, no shortness of breath and no vomiting   Risk factors: hypertension     Past Medical History Past Medical History:  Diagnosis Date  . Alcohol dependence (HCC)   . Hematemesis   . Hypertension    There are no active problems to display for this patient.  Home Medication(s) Prior to Admission medications   Medication Sig Start Date End Date Taking? Authorizing Provider  famotidine (PEPCID) 20 MG tablet Take 1 tablet (20 mg total) by mouth 2 (two) times daily. Patient not taking: Reported on 05/22/2019 08/22/17   Palumbo, April, MD  lisinopril-hydrochlorothiazide (ZESTORETIC) 10-12.5 MG tablet Take 1 tablet by mouth daily. Patient not taking: Reported on 05/22/2019 05/18/19 06/17/19  Claude MangesSoto, Johana, PA-C  sodium chloride (OCEAN) 0.65 % SOLN nasal spray Place 1 spray into both nostrils as needed for congestion. Patient not taking: Reported on 05/22/2019 05/03/19   Roxy HorsemanBrowning, Robert, PA-C                                                                                                                                    Past Surgical History History reviewed. No pertinent surgical history. Family History History reviewed. No pertinent family history.  Social History Social History   Tobacco Use  . Smoking status: Never Smoker  .  Smokeless tobacco: Never Used  Substance Use Topics  . Alcohol use: Yes    Comment: Socially   . Drug use: Not Currently   Allergies Patient has no known allergies.  Review of Systems Review of Systems  Constitutional: Negative for fatigue and fever.  Respiratory: Negative for cough and shortness of breath.   Cardiovascular: Positive for chest pain.  Gastrointestinal: Positive for heartburn. Negative for abdominal pain and vomiting.   All other systems are reviewed and are negative for acute change except as noted in the HPI  Physical Exam Vital Signs  I have reviewed the triage vital signs BP 119/83   Pulse (!) 55   Temp 98.6 F (37 C) (Oral)   Resp 14   SpO2 92%   Physical Exam Vitals signs reviewed.  Constitutional:      General: He is not in acute distress.    Appearance: He is well-developed. He is not diaphoretic.  HENT:     Head: Normocephalic and  atraumatic.     Nose: Nose normal.  Eyes:     General: No scleral icterus.       Right eye: No discharge.        Left eye: No discharge.     Conjunctiva/sclera: Conjunctivae normal.     Pupils: Pupils are equal, round, and reactive to light.  Neck:     Musculoskeletal: Normal range of motion and neck supple.  Cardiovascular:     Rate and Rhythm: Normal rate and regular rhythm.     Heart sounds: No murmur. No friction rub. No gallop.   Pulmonary:     Effort: Pulmonary effort is normal. No respiratory distress.     Breath sounds: Normal breath sounds. No stridor. No rales.  Abdominal:     General: There is no distension.     Palpations: Abdomen is soft.     Tenderness: There is no abdominal tenderness.  Musculoskeletal:        General: No tenderness.  Skin:    General: Skin is warm and dry.     Findings: No erythema or rash.  Neurological:     Mental Status: He is alert and oriented to person, place, and time.     ED Results and Treatments Labs (all labs ordered are listed, but only abnormal results are  displayed) Labs Reviewed  BASIC METABOLIC PANEL - Abnormal; Notable for the following components:      Result Value   Potassium 3.3 (*)    Glucose, Bld 113 (*)    BUN 21 (*)    All other components within normal limits  CBC  TROPONIN I (HIGH SENSITIVITY)  TROPONIN I (HIGH SENSITIVITY)                                                                                                                         EKG  EKG Interpretation  Date/Time:  Monday May 22 2019 02:05:21 EDT Ventricular Rate:  75 PR Interval:  160 QRS Duration: 102 QT Interval:  472 QTC Calculation: 527 R Axis:   85 Text Interpretation:  Normal sinus rhythm with sinus arrhythmia Nonspecific ST abnormality , new Prolonged QT Abnormal ECG Confirmed by Addison Lank [phone]) on 05/22/2019 3:40:31 AM      Radiology Dg Chest 2 View  Result Date: 05/22/2019 CLINICAL DATA:  48 year old male with left side chest pain for 2 days and shortness of breath. EXAM: CHEST - 2 VIEW COMPARISON:  05/15/2019 radiographs. FINDINGS: Stable tortuosity of the thoracic aorta. Normal cardiac size and mediastinal contours. Otherwise Visualized tracheal air column is within normal limits. Stable lung volumes with increased AP dimension to the chest. Mild linear opacity now in the left lower lobe but more resembles atelectasis than infection. Stable mild increased interstitial markings elsewhere. No pneumothorax, pulmonary edema, pleural effusion or other confluent opacity. Stable visualized osseous structures. Negative visible bowel gas pattern. IMPRESSION: Linear new left lower lobe opacity more resembles atelectasis than infection. Electronically Signed   By: Lemmie Evens  Margo AyeHall M.D.   On: 05/22/2019 02:24    Pertinent labs & imaging results that were available during my care of the patient were reviewed by me and considered in my medical decision making (see chart for details).  Medications Ordered in ED Medications  sodium chloride flush (NS) 0.9 %  injection 3 mL (has no administration in time range)  lisinopril (ZESTRIL) tablet 10 mg (10 mg Oral Given 05/22/19 0411)    And  hydrochlorothiazide (MICROZIDE) capsule 12.5 mg (12.5 mg Oral Given 05/22/19 0412)  alum & mag hydroxide-simeth (MAALOX/MYLANTA) 200-200-20 MG/5ML suspension 30 mL (30 mLs Oral Given 05/22/19 0400)    And  lidocaine (XYLOCAINE) 2 % viscous mouth solution 15 mL (15 mLs Oral Given 05/22/19 0400)  nitroGLYCERIN (NITROSTAT) SL tablet 0.4 mg (0.4 mg Sublingual Given 05/22/19 0409)  cloNIDine (CATAPRES) tablet 0.1 mg (0.1 mg Oral Given 05/22/19 0521)                                                                                                                                    Procedures Procedures  (including critical care time)  Medical Decision Making / ED Course I have reviewed the nursing notes for this encounter and the patient's prior records (if available in EHR or on provided paperwork).   Walter Daniel was evaluated in Emergency Department on 05/22/2019 for the symptoms described in the history of present illness. He was evaluated in the context of the global COVID-19 pandemic, which necessitated consideration that the patient might be at risk for infection with the SARS-CoV-2 virus that causes COVID-19. Institutional protocols and algorithms that pertain to the evaluation of patients at risk for COVID-19 are in a state of rapid change based on information released by regulatory bodies including the CDC and federal and state organizations. These policies and algorithms were followed during the patient's care in the ED.  Patient presents with substernal chest pain most consistent with GERD/gastritis/esophagitis.  Found to be severely hypertensive at 240/120s.  EKG without acute ischemic changes or evidence of pericarditis.  Initial troponin negative.  Blood work without evidence of acute endorgan damage.  Second troponin negative.  Patient given home dose of  high blood pressure medicine and GI cocktail.  GI cocktail completely resolved the patient's pain.  Blood pressure still remained high and patient was given clonidine.  Blood pressure improved.  Stable for discharge home with strict return precautions.        Final Clinical Impression(s) / ED Diagnoses Final diagnoses:  Atypical chest pain  Secondary hypertension  Gastroesophageal reflux disease without esophagitis    The patient appears reasonably screened and/or stabilized for discharge and I doubt any other medical condition or other [facility] requiring further screening, evaluation, or treatment in the ED at this time prior to discharge.  Disposition: Discharge  Condition: Good  I have discussed the results, Dx and Tx plan with the patient who expressed understanding  and agree(s) with the plan. Discharge instructions discussed at great length. The patient was given strict return precautions who verbalized understanding of the instructions. No further questions at time of discharge.    ED Discharge Orders    None       Follow Up: Primary care provider  Schedule an appointment as soon as possible for a visit        This chart was dictated using voice recognition software.  Despite best efforts to proofread,  errors can occur which can change the documentation meaning.   Nira Connardama,  Eduardo, MD 05/22/19 [phone]

## 2019-05-22 NOTE — ED Triage Notes (Addendum)
Pt reports left side chest pain x 2 days with sob, denies recent cough or fever. No acute distress is noted. Patient is hypertensive at triage. Recently started on new meds but reports missing 1 dose.

## 2019-06-08 ENCOUNTER — Encounter (HOSPITAL_COMMUNITY): Payer: Self-pay | Admitting: *Deleted

## 2019-06-08 ENCOUNTER — Emergency Department (HOSPITAL_COMMUNITY)
Admission: EM | Admit: 2019-06-08 | Discharge: 2019-06-08 | Disposition: A | Discharge status: Home / Self Care | Payer: Self-pay | Attending: Emergency Medicine | Admitting: Emergency Medicine

## 2019-06-08 ENCOUNTER — Other Ambulatory Visit: Payer: Self-pay

## 2019-06-08 DIAGNOSIS — R51 Headache: Secondary | ICD-10-CM | POA: Insufficient documentation

## 2019-06-08 DIAGNOSIS — R0789 Other chest pain: Secondary | ICD-10-CM | POA: Insufficient documentation

## 2019-06-08 DIAGNOSIS — R42 Dizziness and giddiness: Secondary | ICD-10-CM | POA: Insufficient documentation

## 2019-06-08 DIAGNOSIS — I10 Essential (primary) hypertension: Secondary | ICD-10-CM | POA: Insufficient documentation

## 2019-06-08 LAB — COMPREHENSIVE METABOLIC PANEL
ALT: 39 U/L (ref 0–44)
AST: 28 U/L (ref 15–41)
Albumin: 4.4 g/dL (ref 3.5–5.0)
Alkaline Phosphatase: 71 U/L (ref 38–126)
Anion gap: 11 (ref 5–15)
BUN: 14 mg/dL (ref 6–20)
CO2: 22 mmol/L (ref 22–32)
Calcium: 9.1 mg/dL (ref 8.9–10.3)
Chloride: 105 mmol/L (ref 98–111)
Creatinine, Ser: 1.12 mg/dL (ref 0.61–1.24)
GFR calc Af Amer: >60 mL/min (ref >60)
GFR calc non Af Amer: >60 mL/min (ref >60)
Glucose, Bld: 107 mg/dL — ABNORMAL HIGH (ref 70–99)
Potassium: 3.8 mmol/L (ref 3.5–5.1)
Sodium: 138 mmol/L (ref 135–145)
Total Bilirubin: 0.6 mg/dL (ref 0.3–1.2)
Total Protein: 7.3 g/dL (ref 6.5–8.1)

## 2019-06-08 LAB — CBC
HCT: 42.8 % (ref 39.0–52.0)
Hemoglobin: 14.9 g/dL (ref 13.0–17.0)
MCH: 32 pg (ref 26.0–34.0)
MCHC: 34.8 g/dL (ref 30.0–36.0)
MCV: 92 fL (ref 80.0–100.0)
Platelets: 158 10*3/uL (ref 150–400)
RBC: 4.65 MIL/uL (ref 4.22–5.81)
RDW: 12.1 % (ref 11.5–15.5)
WBC: 8.9 10*3/uL (ref 4.0–10.5)
nRBC: 0 % (ref 0.0–0.2)

## 2019-06-08 LAB — URINALYSIS, ROUTINE W REFLEX MICROSCOPIC
Bacteria, UA: NONE SEEN
Bilirubin Urine: NEGATIVE
Glucose, UA: NEGATIVE mg/dL
Ketones, ur: NEGATIVE mg/dL
Leukocytes,Ua: NEGATIVE
Nitrite: NEGATIVE
Protein, ur: NEGATIVE mg/dL
Specific Gravity, Urine: 1.01 (ref 1.005–1.030)
pH: 6 (ref 5.0–8.0)

## 2019-06-08 LAB — TROPONIN I (HIGH SENSITIVITY)
Troponin I (High Sensitivity): 7 ng/L (ref <18)
Troponin I (High Sensitivity): 8 ng/L (ref <18)

## 2019-06-08 LAB — LIPASE, BLOOD: Lipase: 35 U/L (ref 11–51)

## 2019-06-08 MED ORDER — CLONIDINE HCL 0.1 MG PO TABS: 0.1000 mg | ORAL_TABLET | Freq: Once | ORAL | Status: DC

## 2019-06-08 MED ORDER — SODIUM CHLORIDE 0.9% FLUSH: 3.0000 mL | Freq: Once | INTRAVENOUS | Status: DC

## 2019-06-08 NOTE — ED Provider Notes (Signed)
[facility] EMERGENCY DEPARTMENT Provider Note   CSN: [id number] Arrival date & time: 06/08/19  0037    History   Chief Complaint Chief Complaint  Patient presents with   Hypertension    HPI Walter Daniel is a 48 y.o. male.     Patient is a 47 year old male with a history of recently diagnosed hypertension who presents with elevated blood pressures.  He has been seen several times over the last month for elevated blood pressures.  He was recently started on Zestoretic.  He states he has been taking it for about the last 3 weeks.  He states that he still has episodes where he feels like his blood pressure goes up when he gets a little dizzy with a headache and sometimes he has some pain in the center of his chest which he describes as a burning pain.  He denies any current symptoms.  No chest pain, no headache.  He denies any numbness or weakness to his extremities.  No trouble with his balance.  No vision or speech deficits.  He has been taking his blood pressure medicine at home and he states that typically it is in the 235 range systolic but sometimes it bumps up and that is when he feels bad.  He states he has been taking it every day although he did not take it today.  He does state he feels like the blood pressure medicine makes him feel a little bit bad and after he takes it about 30 minutes to an hour later he gets a little bit lightheaded and dizzy and fatigued.  He states that he has followed up with a primary care physician [facility] [place].  He states this was about a week ago.  He states that he did not make any changes to his blood pressure medicine at that time.     Past Medical History:  Diagnosis Date   Alcohol dependence ([place])    Hematemesis    Hypertension     There are no active problems to display for this patient.   History reviewed. No pertinent surgical history.      Home Medications     Prior to Admission medications   Medication Sig Start Date End Date Taking? Authorizing Provider  famotidine (PEPCID) 20 MG tablet Take 1 tablet (20 mg total) by mouth 2 (two) times daily. Patient not taking: Reported on 05/22/2019 08/22/17   Palumbo, April, MD  lisinopril-hydrochlorothiazide (ZESTORETIC) 10-12.5 MG tablet Take 1 tablet by mouth daily. Patient not taking: Reported on 05/22/2019 05/18/19 06/17/19  Janeece Fitting, PA-C  sodium chloride (OCEAN) 0.65 % SOLN nasal spray Place 1 spray into both nostrils as needed for congestion. Patient not taking: Reported on 05/22/2019 05/03/19   Montine Circle, PA-C    Family History No family history on file.  Social History Social History   Tobacco Use   Smoking status: Never Smoker   Smokeless tobacco: Never Used  Substance Use Topics   Alcohol use: Yes    Comment: Socially    Drug use: Not Currently     Allergies   Patient has no known allergies.   Review of Systems Review of Systems  Constitutional: Negative for chills, diaphoresis, fatigue and fever.  HENT: Negative for congestion, rhinorrhea and sneezing.   Eyes: Negative.   Respiratory: Negative for cough, chest tightness and shortness of breath.   Cardiovascular: Positive for chest pain. Negative for leg swelling.  Gastrointestinal:  Negative for abdominal pain, blood in stool, diarrhea, nausea and vomiting.  Genitourinary: Negative for difficulty urinating, flank pain, frequency and hematuria.  Musculoskeletal: Negative for arthralgias and back pain.  Skin: Negative for rash.  Neurological: Positive for dizziness and headaches. Negative for speech difficulty, weakness and numbness.     Physical Exam Updated Vital Signs BP (!) 191/101    Pulse (!) 59    Temp 98.2 F (36.8 C) (Oral)    Resp 17    SpO2 98%   Physical Exam Constitutional:      Appearance: He is well-developed.  HENT:     Head: Normocephalic and atraumatic.  Eyes:     Pupils: Pupils are equal,  round, and reactive to light.  Neck:     Musculoskeletal: Normal range of motion and neck supple.  Cardiovascular:     Rate and Rhythm: Normal rate and regular rhythm.     Heart sounds: Normal heart sounds.  Pulmonary:     Effort: Pulmonary effort is normal. No respiratory distress.     Breath sounds: Normal breath sounds. No wheezing or rales.  Chest:     Chest wall: No tenderness.  Abdominal:     General: Bowel sounds are normal.     Palpations: Abdomen is soft.     Tenderness: There is no abdominal tenderness. There is no guarding or rebound.  Musculoskeletal: Normal range of motion.  Lymphadenopathy:     Cervical: No cervical adenopathy.  Skin:    General: Skin is warm and dry.     Findings: No rash.  Neurological:     General: No focal deficit present.     Mental Status: He is alert and oriented to person, place, and time.      ED Treatments / Results  Labs (all labs ordered are listed, but only abnormal results are displayed) Labs Reviewed  COMPREHENSIVE METABOLIC PANEL - Abnormal; Notable for the following components:      Result Value   Glucose, Bld 107 (*)    All other components within normal limits  URINALYSIS, ROUTINE W REFLEX MICROSCOPIC - Abnormal; Notable for the following components:   Color, Urine STRAW (*)    Hgb urine dipstick MODERATE (*)    All other components within normal limits  LIPASE, BLOOD  CBC  TROPONIN I (HIGH SENSITIVITY)  TROPONIN I (HIGH SENSITIVITY)    EKG EKG Interpretation  Date/Time:  Thursday June 08 2019 01:20:42 EDT Ventricular Rate:  61 PR Interval:  150 QRS Duration: 110 QT Interval:  482 QTC Calculation: 485 R Axis:   76 Text Interpretation:  Normal sinus rhythm Abnormal QRS-T angle, consider primary T wave abnormality Prolonged QT Abnormal ECG similar to prior tracings Confirmed by Rolan BuccoBelfi, Dalila Arca ([id number]) on 06/08/2019 11:05:52 AM   Radiology No results found.  Procedures Procedures (including critical care  time)  Medications Ordered in ED Medications  sodium chloride flush (NS) 0.9 % injection 3 mL (has no administration in time range)  cloNIDine (CATAPRES) tablet 0.1 mg (has no administration in time range)     Initial Impression / Assessment and Plan / ED Course  I have reviewed the triage vital signs and the nursing notes.  Pertinent labs & imaging results that were available during my care of the patient were reviewed by me and considered in my medical decision making (see chart for details).        Patient presents with elevated blood pressure.  He is having some side effects from blood pressure medicine  including dizziness and fatigue after he takes it.  I did encourage him to continue taking it and if he is not tolerating it after the next few weeks, he may need to have it changed by his primary care physician.  He is currently asymptomatic.  He has had recent chest x-rays which were clear.  His EKG does not show any ischemic changes.  He has no neurologic deficits.  No symptoms that would be more concerning for thoracic dissection.  He has had 2 delta troponins that have been negative today.  I had a long discussion about him taking his blood pressure medicine consistently and following up next week with his primary care provider.  All the history and instructions were done through the Spanish interpreter.  He understands these instructions and will follow-up next week with his primary care doctor.  I encouraged him to write down his blood pressures at home.  It does not sound like they are consistently elevated so at this point I do not feel that his blood pressure medicine needs to be changed.  He did not take his blood pressure medicine today and I did give him 1 oral dose of clonidine.  Return precautions were given.  Final Clinical Impressions(s) / ED Diagnoses   Final diagnoses:  Essential hypertension    ED Discharge Orders    None       Rolan BuccoBelfi, Aero Drummonds, MD 06/08/19  [phone]

## 2019-06-08 NOTE — ED Triage Notes (Addendum)
Per interpreter, pt has been here several times for blood pressure; reports compliance with medications that have previously been prescribed. When he takes medication, pt reports headaches and dizziness. Reports feeling fine all day, started feeling bad tonight around 2300  Also reports burning intermittent chest/epigastric pain for the past month

## 2019-06-27 ENCOUNTER — Ambulatory Visit: Payer: Self-pay | Admitting: Internal Medicine

## 2021-07-18 ENCOUNTER — Emergency Department (HOSPITAL_COMMUNITY)
Admission: EM | Admit: 2021-07-18 | Discharge: 2021-07-19 | Disposition: A | Discharge status: Home / Self Care | Payer: Self-pay | Attending: Emergency Medicine | Admitting: Emergency Medicine

## 2021-07-18 ENCOUNTER — Encounter (HOSPITAL_COMMUNITY): Payer: Self-pay | Admitting: *Deleted

## 2021-07-18 ENCOUNTER — Other Ambulatory Visit: Payer: Self-pay

## 2021-07-18 DIAGNOSIS — I10 Essential (primary) hypertension: Secondary | ICD-10-CM | POA: Insufficient documentation

## 2021-07-18 DIAGNOSIS — R1013 Epigastric pain: Secondary | ICD-10-CM | POA: Insufficient documentation

## 2021-07-18 DIAGNOSIS — Z79899 Other long term (current) drug therapy: Secondary | ICD-10-CM | POA: Insufficient documentation

## 2021-07-18 LAB — URINALYSIS, ROUTINE W REFLEX MICROSCOPIC
Bilirubin Urine: NEGATIVE
Glucose, UA: NEGATIVE mg/dL
Ketones, ur: NEGATIVE mg/dL
Leukocytes,Ua: NEGATIVE
Nitrite: NEGATIVE
Protein, ur: NEGATIVE mg/dL
Specific Gravity, Urine: 1.02 (ref 1.005–1.030)
pH: 6 (ref 5.0–8.0)

## 2021-07-18 LAB — COMPREHENSIVE METABOLIC PANEL
ALT: 30 U/L (ref 0–44)
AST: 29 U/L (ref 15–41)
Albumin: 4.2 g/dL (ref 3.5–5.0)
Alkaline Phosphatase: 76 U/L (ref 38–126)
Anion gap: 8 (ref 5–15)
BUN: 16 mg/dL (ref 6–20)
CO2: 20 mmol/L — ABNORMAL LOW (ref 22–32)
Calcium: 9 mg/dL (ref 8.9–10.3)
Chloride: 107 mmol/L (ref 98–111)
Creatinine, Ser: 0.94 mg/dL (ref 0.61–1.24)
GFR, Estimated: >60 mL/min (ref >60)
Glucose, Bld: 103 mg/dL — ABNORMAL HIGH (ref 70–99)
Potassium: 4.1 mmol/L (ref 3.5–5.1)
Sodium: 135 mmol/L (ref 135–145)
Total Bilirubin: 0.9 mg/dL (ref 0.3–1.2)
Total Protein: 7.1 g/dL (ref 6.5–8.1)

## 2021-07-18 LAB — CBC WITH DIFFERENTIAL/PLATELET
Abs Immature Granulocytes: 0.02 10*3/uL (ref 0.00–0.07)
Basophils Absolute: 0.1 10*3/uL (ref 0.0–0.1)
Basophils Relative: 1 %
Eosinophils Absolute: 0.1 10*3/uL (ref 0.0–0.5)
Eosinophils Relative: 1 %
HCT: 47.1 % (ref 39.0–52.0)
Hemoglobin: 16.4 g/dL (ref 13.0–17.0)
Immature Granulocytes: 0 %
Lymphocytes Relative: 21 %
Lymphs Abs: 2.1 10*3/uL (ref 0.7–4.0)
MCH: 32.5 pg (ref 26.0–34.0)
MCHC: 34.8 g/dL (ref 30.0–36.0)
MCV: 93.5 fL (ref 80.0–100.0)
Monocytes Absolute: 0.7 10*3/uL (ref 0.1–1.0)
Monocytes Relative: 8 %
Neutro Abs: 6.7 10*3/uL (ref 1.7–7.7)
Neutrophils Relative %: 69 %
Platelets: 146 10*3/uL — ABNORMAL LOW (ref 150–400)
RBC: 5.04 MIL/uL (ref 4.22–5.81)
RDW: 12.9 % (ref 11.5–15.5)
WBC: 9.7 10*3/uL (ref 4.0–10.5)
nRBC: 0 % (ref 0.0–0.2)

## 2021-07-18 LAB — URINALYSIS, MICROSCOPIC (REFLEX): WBC, UA: NONE SEEN WBC/hpf (ref 0–5)

## 2021-07-18 LAB — LIPASE, BLOOD: Lipase: 31 U/L (ref 11–51)

## 2021-07-18 NOTE — ED Triage Notes (Signed)
C/o abd pain with n and v for 2-3 days

## 2021-07-18 NOTE — ED Provider Notes (Signed)
Emergency Medicine Provider Triage Evaluation Note  [facility] , a 50 y.o. male  was evaluated in triage.  Pt complains of about 2 weeks of abdominal pain.  The pain is worst after eating.  He reports waking up every morning and his abdomen is bloated.  The pain will comes and goes depending on when he eats.  He denies any chest pain or shortness of breath.  No fever.  Review of Systems  Positive: Abdominal pain Negative: Fevers  Physical Exam  BP (!) 191/99 (BP Location: Left Arm)   Pulse 75   Temp 97.9 F (36.6 C)   Resp 15   Ht 5\' 9"  (1.753 m)   Wt 90.7 kg   SpO2 100%   BMI 29.53 kg/m  Gen:   Awake, no distress   Resp:  Normal effort  MSK:   Moves extremities without difficulty  Other:  Mild TTP in abdomen.   Medical Decision Making  Medically screening exam initiated at 7:05 PM.  Appropriate orders placed.  [facility] was informed that the remainder of the evaluation will be completed by another provider, this initial triage assessment does not replace that evaluation, and the importance of remaining in the ED until their evaluation is complete.  Note: Portions of this report may have been transcribed using voice recognition software. Every effort was made to ensure accuracy; however, inadvertent computerized transcription errors may be present  Interactions with patient performed through professional Spanish-speaking medical interpreter.  He is significantly hypertensive, has a history of hypertension.  Given his pain has been ongoing for 2 weeks and waxes and wanes with eating at this time it does not sound consistent with a dissection.   Ciro Backer, PA-C 07/18/21 1907    09/17/21, MD 07/18/21 [phone]

## 2021-07-18 NOTE — ED Triage Notes (Signed)
Pt arrived by gems from home  speaks spanish only  iv per ems  they gave the pt iv fentanyl and zofran  he speaks spanish

## 2021-07-19 ENCOUNTER — Emergency Department (HOSPITAL_COMMUNITY): Payer: Self-pay

## 2021-07-19 MED ORDER — LISINOPRIL 20 MG PO TABS
40.0000 mg | ORAL_TABLET | Freq: Once | ORAL | Status: AC
  Administered 2021-07-19: 40 mg via ORAL
  Filled 2021-07-19: qty 2

## 2021-07-19 MED ORDER — OMEPRAZOLE 20 MG PO CPDR: 20.0000 mg | DELAYED_RELEASE_CAPSULE | Freq: Every day | ORAL | 0 refills | Status: DC

## 2021-07-19 MED ORDER — KETOROLAC TROMETHAMINE 15 MG/ML IJ SOLN
15.0000 mg | Freq: Once | INTRAMUSCULAR | Status: AC
  Administered 2021-07-19: 15 mg via INTRAVENOUS
  Filled 2021-07-19: qty 1

## 2021-07-19 MED ORDER — IOHEXOL 350 MG/ML SOLN
75.0000 mL | Freq: Once | INTRAVENOUS | Status: AC | PRN
  Administered 2021-07-19: 75 mL via INTRAVENOUS

## 2021-07-19 MED ORDER — CALCIUM CARBONATE ANTACID 500 MG PO CHEW: 1.0000 | CHEWABLE_TABLET | Freq: Every day | ORAL | 0 refills | Status: AC

## 2021-07-19 NOTE — ED Provider Notes (Addendum)
[facility] EMERGENCY DEPARTMENT Provider Note   CSN: [id number] Arrival date & time: 07/18/21  1615     History Chief Complaint  Patient presents with   Abdominal Pain    Walter Daniel is a 50 y.o. male past medical history of hypertension who presented last night due to symptoms of abdominal pain.  Patient reports that this has been going on for 3 weeks however feels as though he is having more severe symptoms.  Waxes and wanes. Says that the pain is oftentimes mild however when it comes on it is very severe.  Reports that this pain is mostly umbilical and radiates down over his bladder.  "It feels like there is a ball in there."  Denies nausea vomiting diarrhea or constipation.  Reports that he has been taking ibuprofen to help with his pain.  Denies any fevers, chills, chest pain or shortness of breath.  No previous abdominal surgeries.  Normal bowel movements denies melena.  Patient reports he has been trying to eat less grease and unhealthy foods and has added more vegetables to his diet.  Patient previously diagnosed with alcohol abuse however patient reports he has not had a drink in 4 years.  Abdominal Pain Associated symptoms: no chest pain, no chills, no constipation, no cough, no diarrhea, no dysuria, no fever, no hematuria, no nausea and no shortness of breath       Past Medical History:  Diagnosis Date   Alcohol dependence ([facility])    Hematemesis    Hypertension     There are no problems to display for this patient.   History reviewed. No pertinent surgical history.     No family history on file.  Social History   Tobacco Use   Smoking status: Never   Smokeless tobacco: Never  Substance Use Topics   Alcohol use: Yes    Comment: Socially    Drug use: Not Currently    Home Medications Prior to Admission medications   Medication Sig Start Date End Date Taking? Authorizing Provider  famotidine (PEPCID) 20 MG tablet Take 1 tablet (20 mg  total) by mouth 2 (two) times daily. Patient not taking: Reported on 05/22/2019 08/22/17   Palumbo, April, MD  lisinopril-hydrochlorothiazide (ZESTORETIC) 10-12.5 MG tablet Take 1 tablet by mouth daily. Patient not taking: Reported on 05/22/2019 05/18/19 06/17/19  Claude Manges, PA-C  sodium chloride (OCEAN) 0.65 % SOLN nasal spray Place 1 spray into both nostrils as needed for congestion. Patient not taking: Reported on 05/22/2019 05/03/19   Roxy Horseman, PA-C    Allergies    Patient has no known allergies.  Review of Systems   Review of Systems  Constitutional:  Negative for chills, diaphoresis and fever.  Eyes:  Negative for pain and visual disturbance.  Respiratory:  Negative for cough and shortness of breath.   Cardiovascular:  Negative for chest pain and palpitations.  Gastrointestinal:  Positive for abdominal pain. Negative for constipation, diarrhea and nausea.  Genitourinary:  Negative for dysuria, hematuria and urgency.  Musculoskeletal:  Negative for arthralgias and back pain.  Skin:  Negative for color change and rash.  Neurological:  Positive for headaches. Negative for seizures and syncope.  All other systems reviewed and are negative.  Physical Exam Updated Vital Signs BP (!) 159/98   Pulse (!) 56   Temp 97.9 F (36.6 C)   Resp 16   Ht 5\' 9"  (1.753 m)   Wt 90.7 kg   SpO2 100%   BMI 29.53  kg/m   Physical Exam Vitals and nursing note reviewed.  Constitutional:      General: He is not in acute distress.    Appearance: Normal appearance. He is not ill-appearing.  HENT:     Head: Normocephalic and atraumatic.  Eyes:     General: No scleral icterus.    Conjunctiva/sclera: Conjunctivae normal.  Cardiovascular:     Rate and Rhythm: Normal rate and regular rhythm.  Pulmonary:     Effort: Pulmonary effort is normal.     Breath sounds: Normal breath sounds.  Abdominal:     General: Bowel sounds are normal.     Palpations: Abdomen is soft.     Tenderness: There is  abdominal tenderness in the periumbilical area and suprapubic area.  Skin:    General: Skin is warm and dry.     Findings: No rash.  Neurological:     Mental Status: He is alert.  Psychiatric:        Mood and Affect: Mood normal.    ED Results / Procedures / Treatments   Labs (all labs ordered are listed, but only abnormal results are displayed) Labs Reviewed  COMPREHENSIVE METABOLIC PANEL - Abnormal; Notable for the following components:      Result Value   CO2 20 (*)    Glucose, Bld 103 (*)    All other components within normal limits  CBC WITH DIFFERENTIAL/PLATELET - Abnormal; Notable for the following components:   Platelets 146 (*)    All other components within normal limits  URINALYSIS, ROUTINE W REFLEX MICROSCOPIC - Abnormal; Notable for the following components:   Hgb urine dipstick SMALL (*)    All other components within normal limits  URINALYSIS, MICROSCOPIC (REFLEX) - Abnormal; Notable for the following components:   Bacteria, UA RARE (*)    All other components within normal limits  LIPASE, BLOOD    EKG None  Radiology No results found.  Procedures Procedures   Medications Ordered in ED Medications - No data to display  ED Course  I have reviewed the triage vital signs and the nursing notes.  Pertinent labs & imaging results that were available during my care of the patient were reviewed by me and considered in my medical decision making (see chart for details).    MDM Rules/Calculators/A&P 50 year-old Male past medical history of hypertension who presented last night due to symptoms of abdominal pain.  Patient reports that this has been going on for 3 weeks however feels as though he is having more severe symptoms.  Waxes and wanes. Says that the pain is oftentimes mild however when it comes on it is very severe.  Reports that this pain is mostly umbilical and radiates down over his bladder.  "It feels like there is a ball in there."    The  differential diagnosis for abdominal pain includes, but is not limited to AAA, gastroenteritis, appendicitis, Bowel obstruction, Bowel perforation. Gastroparesis, DKA, Hernia, Inflammatory bowel disease, mesenteric ischemia, pancreatitis, peritonitis SBP, volvulus.  All of these were considered throughout the evaluation of this patient.  Patient with normal white count and afebrile.  Low suspicion for any infectious process.  Patient with normal bowel sounds and I am not concerned for an obstruction or perforation at this time.  Denying constipation or diarrhea which lowers my suspicion of IBS/IBD.  Because of patient's alcohol history I was suspicious for some liver or pancreas process however blood work and CT scan are not significant for this.  CT called  for likely gastritis and esophagitis.  Patient former alcohol abuser however reports he has not had any drinks in 3 to 4 years.  Has cut back on spicy food.  He used to take omeprazole for GERD however he stopped it because he saw a doctor who said that if you use it all the time every day he may have damage to his liver.  He did report that it helped him with his symptoms.  Patient denied any urinary symptoms and urine does not show infection today.  Lab work without concerns.  Patient noted to be bradycardic multiple times throughout his stay in the emergency department today.  All of these episodes were while the patient was asleep.  Heart rate never lower than 50. No CP, SOB or associated sx.  Patient without any signs of upper or lower GI bleeding.  I will discharge him home with omeprazole as well as 14 days of Tums.  He has been referred to gastroenterology to discuss his ongoing symptoms and diagnoses.  He understands that he will need to call this office early next week to set up an appointment.  We discussed important modifications to diet and patient reports understanding and is thankful for his care today.  He voices no further questions and is  stable for discharge with follow-up.  Information about both esophagitis and gastritis attached to his discharge papers.    Final Clinical Impression(s) / ED Diagnoses Final diagnoses:  Epigastric pain    Rx / DC Orders Results and diagnoses were explained to the patient. Return precautions discussed in full. Patient had no additional questions and expressed complete understanding.     Saddie Benders, PA-C 07/19/21 1004    Saddie Benders, PA-C 07/19/21 1007    Ernie Avena, MD 07/19/21 [phone]

## 2021-07-19 NOTE — ED Notes (Signed)
Patient transported to CT 

## 2021-09-15 ENCOUNTER — Telehealth: Payer: Self-pay

## 2021-09-15 NOTE — Telephone Encounter (Signed)
NOTES SCANNED TO REFERRAL 

## 2022-01-26 IMAGING — CT CT ABD-PELV W/ CM
2 of 6 series · 16 of 46 positions shown, 18 images · IV contrast (Omni 300)
Comparison: 08/22/2017 abdominal radiograph.

CLINICAL DATA: Abdominal pain, not localized.

EXAM:
CT ABDOMEN AND PELVIS WITH CONTRAST
TECHNIQUE: Multidetector CT imaging of the abdomen and pelvis was performed
using the standard protocol following bolus administration of
intravenous contrast.
CONTRAST:  75mL OMNIPAQUE IOHEXOL 350 MG/ML SOLN

[Series 3: a/p w/ 5mm · axial · 0.95mm/px · z∈[+941,+1386]mm · 13 of 103 slices shown, 15 images]
[im 7/103  soft-tissue]
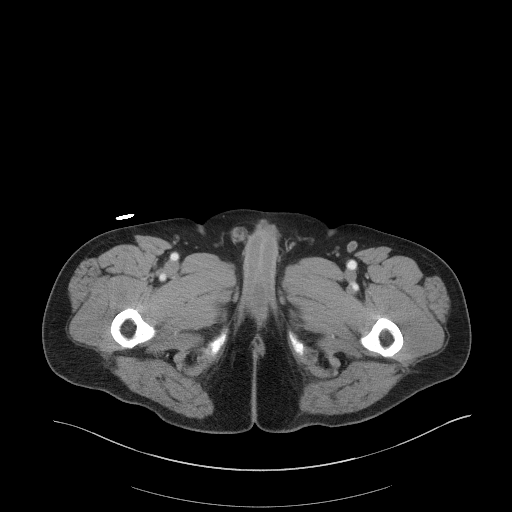
[im 7/103  bone]
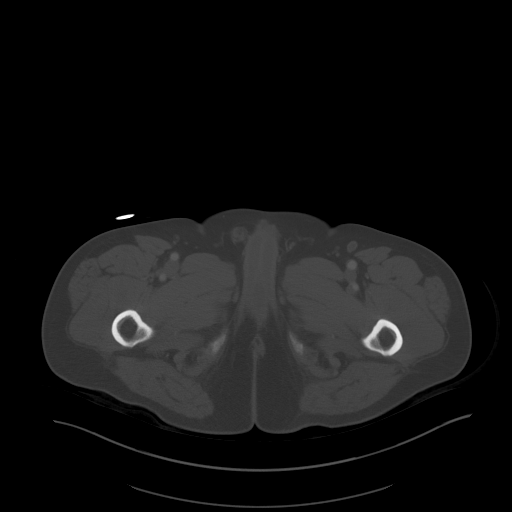
[im 13/103  soft-tissue]
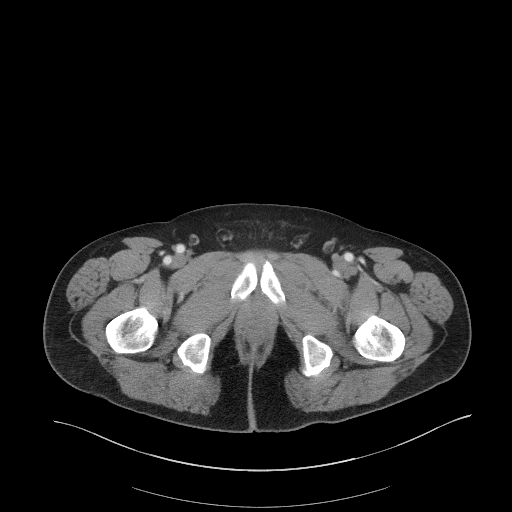
[im 20/103  soft-tissue]
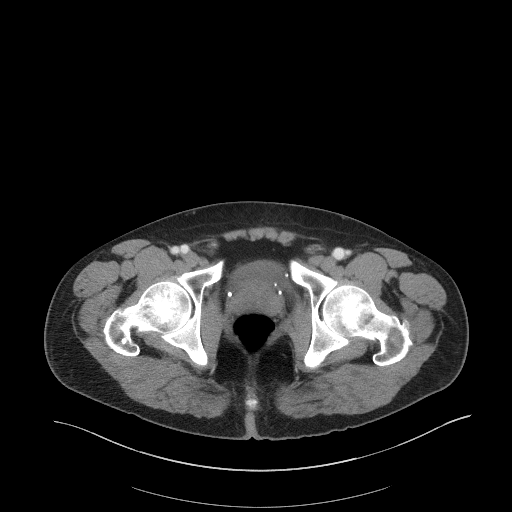
[im 32/103  soft-tissue]
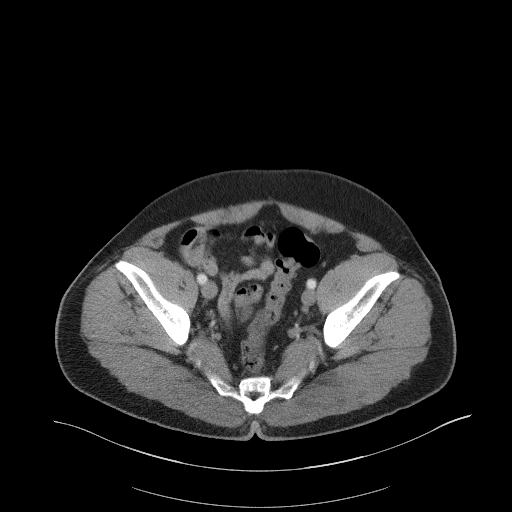
[im 39/103  soft-tissue]
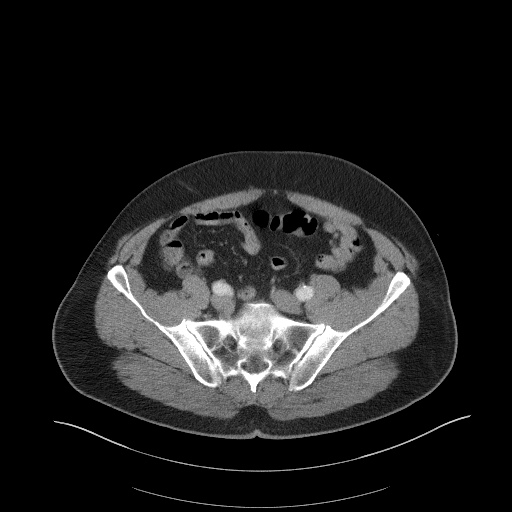
[im 45/103  soft-tissue]
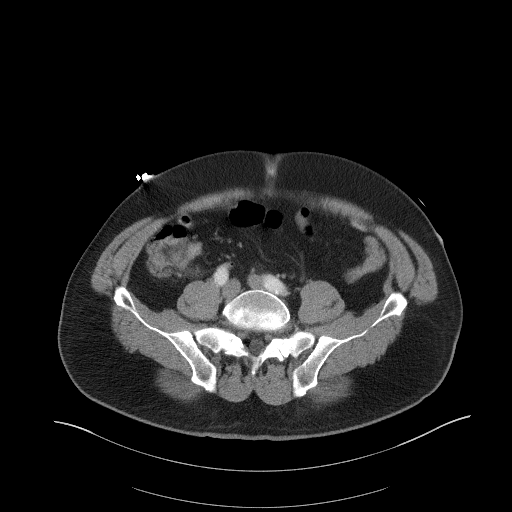
[im 52/103  soft-tissue]
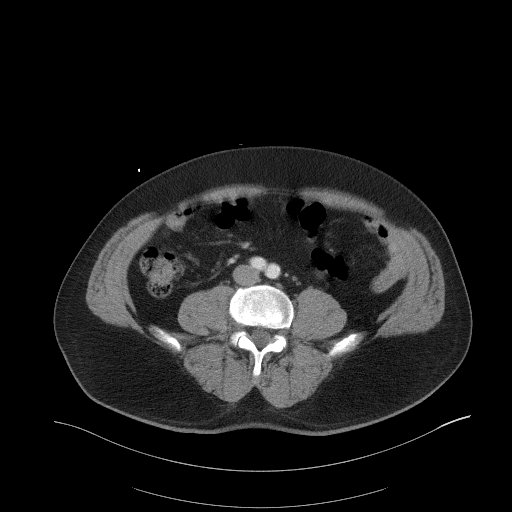
[im 58/103  soft-tissue]
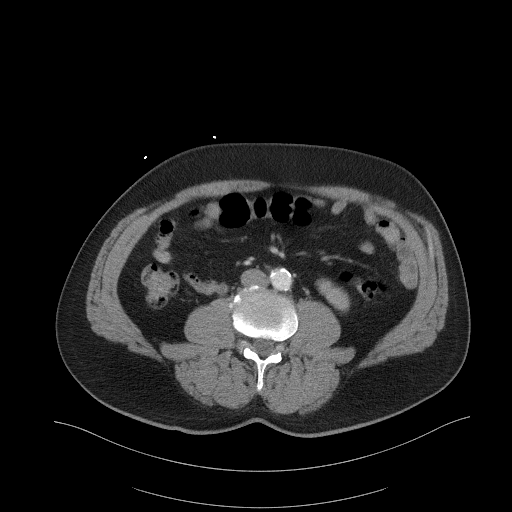
[im 64/103  soft-tissue]
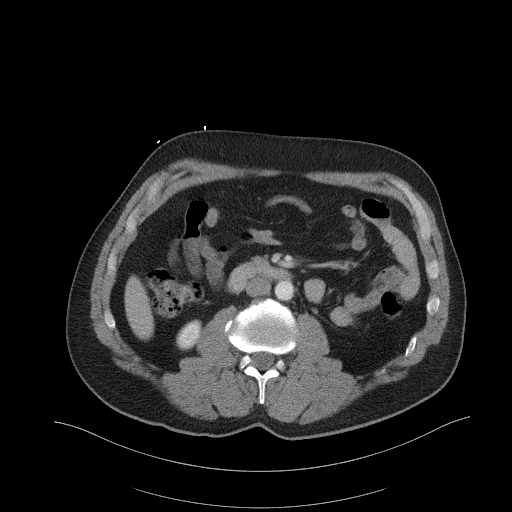
[im 64/103  bone]
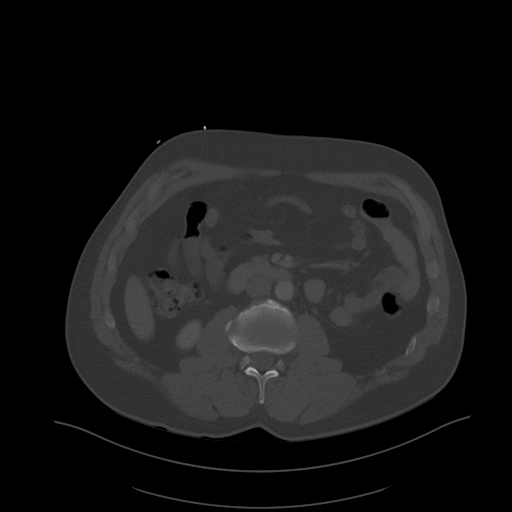
[im 71/103  soft-tissue]
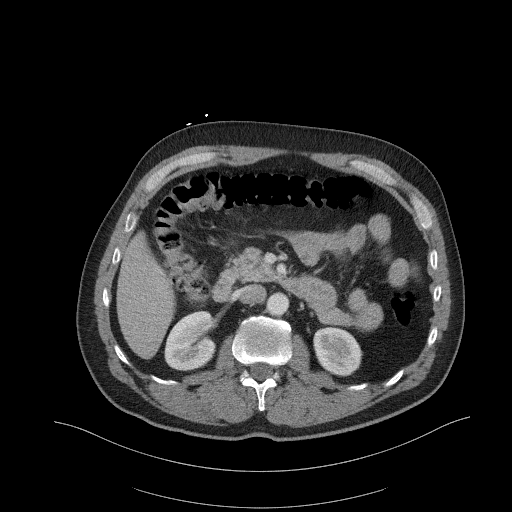
[im 83/103  soft-tissue]
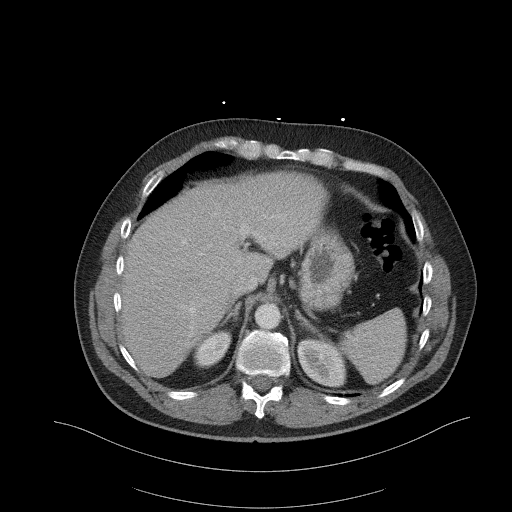
[im 90/103  soft-tissue]
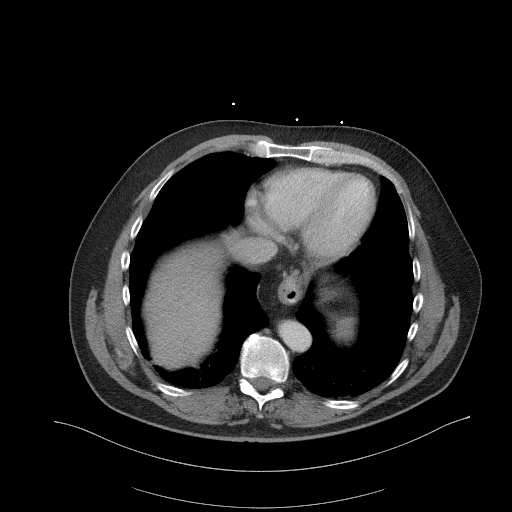
[im 96/103  soft-tissue]
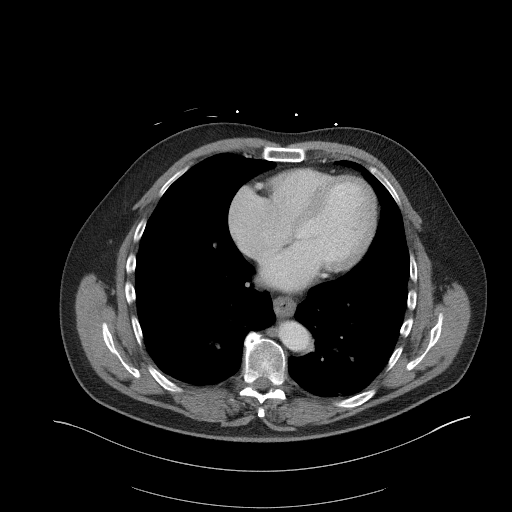

[Series 5: a/p w/ cor · coronal · 0.79mm/px · 3 of 165 slices shown]
[im 55/165  soft-tissue]
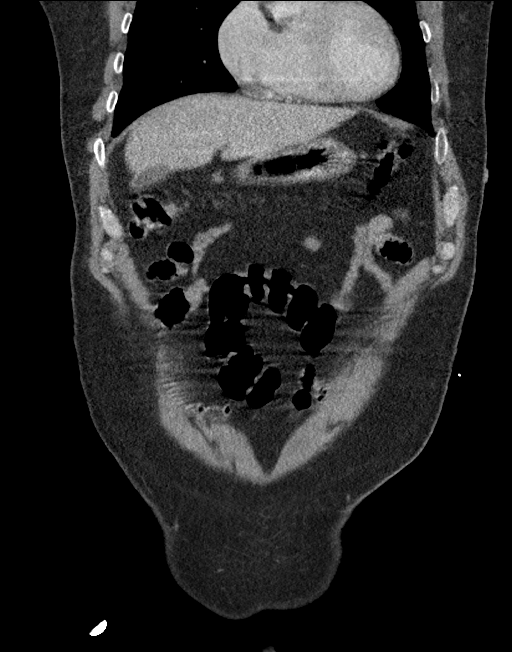
[im 73/165  soft-tissue]
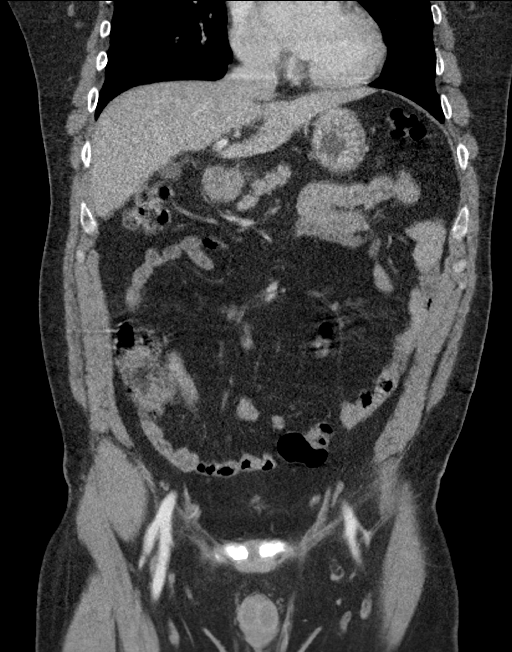
[im 92/165  soft-tissue]
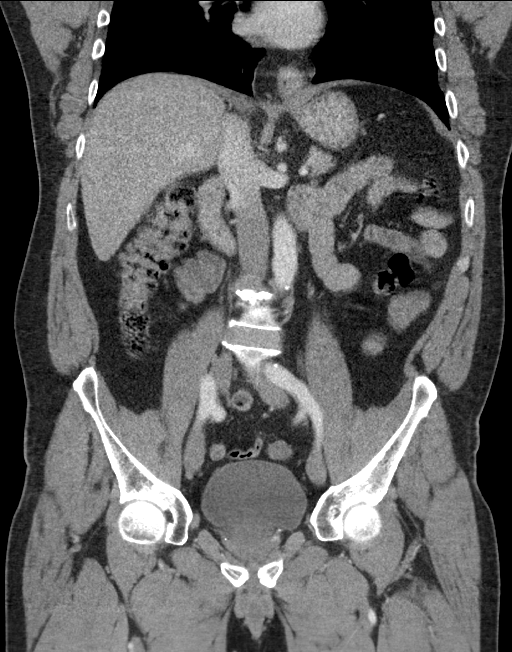

[16 of 46 positions shown; findings below may reference images not displayed]

FINDINGS: Lower chest: Bibasilar subsegmental atelectasis. Normal heart size
without pericardial or pleural effusion.

Mild distal esophageal wall thickening.

Hepatobiliary: Too small to characterize high left hepatic lobe
lesions. Normal gallbladder, without biliary ductal dilatation.

Pancreas: Normal, without mass or ductal dilatation.

Spleen: Normal in size, without focal abnormality.

Adrenals/Urinary Tract: Normal adrenal glands. Normal kidneys,
without hydronephrosis. Normal urinary bladder.

Stomach/Bowel: Apparent proximal gastric wall thickening including
on [DATE] is at least partially felt to be due to underdistention.
Normal colon, appendix, and terminal ileum. Minimal motion
degradation throughout the abdomen. Normal small bowel.

Vascular/Lymphatic: Aortic atherosclerosis. No abdominopelvic
adenopathy.

Reproductive: Normal prostate.

Other: No significant free fluid.  No free intraperitoneal air.

Musculoskeletal: No acute osseous abnormality.
IMPRESSION: 1. Mildly motion degraded exam throughout.
2. Distal esophageal wall thickening, suspicious for esophagitis.
Proximal gastric wall thickening could represent concurrent
gastritis or be artifactual in the setting of underdistention.
3. No other explanation for patient's symptoms.
4.  Aortic Atherosclerosis ([id number]).

## 2022-04-16 ENCOUNTER — Other Ambulatory Visit: Payer: Self-pay

## 2022-04-16 ENCOUNTER — Encounter (HOSPITAL_COMMUNITY): Payer: Self-pay | Admitting: Emergency Medicine

## 2022-04-16 ENCOUNTER — Emergency Department (HOSPITAL_COMMUNITY)
Admission: EM | Admit: 2022-04-16 | Discharge: 2022-04-17 | Disposition: A | Discharge status: Home / Self Care | Payer: Self-pay | Attending: Emergency Medicine | Admitting: Emergency Medicine

## 2022-04-16 DIAGNOSIS — K2970 Gastritis, unspecified, without bleeding: Secondary | ICD-10-CM | POA: Insufficient documentation

## 2022-04-16 DIAGNOSIS — R1033 Periumbilical pain: Secondary | ICD-10-CM

## 2022-04-16 NOTE — ED Triage Notes (Signed)
Pt reported to ED with c/o pain to umbilicus for approximately x1.5 months. Pt states he has been constipated as well and has had intermittent nausea.

## 2022-04-17 ENCOUNTER — Emergency Department (HOSPITAL_COMMUNITY): Payer: Self-pay

## 2022-04-17 LAB — CBC
HCT: 44.7 % (ref 39.0–52.0)
Hemoglobin: 15.4 g/dL (ref 13.0–17.0)
MCH: 32.4 pg (ref 26.0–34.0)
MCHC: 34.5 g/dL (ref 30.0–36.0)
MCV: 93.9 fL (ref 80.0–100.0)
Platelets: 166 10*3/uL (ref 150–400)
RBC: 4.76 MIL/uL (ref 4.22–5.81)
RDW: 13.2 % (ref 11.5–15.5)
WBC: 8.6 10*3/uL (ref 4.0–10.5)
nRBC: 0 % (ref 0.0–0.2)

## 2022-04-17 LAB — URINALYSIS, ROUTINE W REFLEX MICROSCOPIC
Bilirubin Urine: NEGATIVE
Glucose, UA: NEGATIVE mg/dL
Ketones, ur: 5 mg/dL — AB
Leukocytes,Ua: NEGATIVE
Nitrite: NEGATIVE
Protein, ur: NEGATIVE mg/dL
Specific Gravity, Urine: 1.023 (ref 1.005–1.030)
pH: 6 (ref 5.0–8.0)

## 2022-04-17 LAB — COMPREHENSIVE METABOLIC PANEL
ALT: 25 U/L (ref 0–44)
AST: 30 U/L (ref 15–41)
Albumin: 4.3 g/dL (ref 3.5–5.0)
Alkaline Phosphatase: 80 U/L (ref 38–126)
Anion gap: 7 (ref 5–15)
BUN: 21 mg/dL — ABNORMAL HIGH (ref 6–20)
CO2: 24 mmol/L (ref 22–32)
Calcium: 9.1 mg/dL (ref 8.9–10.3)
Chloride: 109 mmol/L (ref 98–111)
Creatinine, Ser: 0.99 mg/dL (ref 0.61–1.24)
GFR, Estimated: >60 mL/min (ref >60)
Glucose, Bld: 115 mg/dL — ABNORMAL HIGH (ref 70–99)
Potassium: 4.1 mmol/L (ref 3.5–5.1)
Sodium: 140 mmol/L (ref 135–145)
Total Bilirubin: 0.7 mg/dL (ref 0.3–1.2)
Total Protein: 7.2 g/dL (ref 6.5–8.1)

## 2022-04-17 LAB — LIPASE, BLOOD: Lipase: 27 U/L (ref 11–51)

## 2022-04-17 MED ORDER — OMEPRAZOLE 20 MG PO CPDR: 20.0000 mg | DELAYED_RELEASE_CAPSULE | Freq: Every day | ORAL | 3 refills | Status: DC

## 2022-04-17 MED ORDER — IOHEXOL 300 MG/ML  SOLN
100.0000 mL | Freq: Once | INTRAMUSCULAR | Status: AC | PRN
  Administered 2022-04-17: 100 mL via INTRAVENOUS

## 2022-04-17 MED ORDER — DICYCLOMINE HCL 20 MG PO TABS: 20.0000 mg | ORAL_TABLET | Freq: Three times a day (TID) | ORAL | 0 refills | Status: DC

## 2022-04-17 NOTE — ED Provider Notes (Signed)
[facility] EMERGENCY DEPARTMENT Provider Note   CSN: [id number] Arrival date & time: 04/16/22  2150     History  Chief Complaint  Patient presents with   Abdominal Pain    Walter Daniel is a 51 y.o. male.  Presents to the emergency department for evaluation of abdominal pain.  Patient experiencing pain just to the right of his umbilicus that has been ongoing for about a month.  He has had some nausea but no vomiting.  He feels constipated.  Patient reports that he gets full when he eats very quickly.       Home Medications Prior to Admission medications   Medication Sig Start Date End Date Taking? Authorizing Provider  famotidine (PEPCID) 20 MG tablet Take 1 tablet (20 mg total) by mouth 2 (two) times daily. Patient not taking: Reported on 05/22/2019 08/22/17   Palumbo, April, MD  lisinopril-hydrochlorothiazide (ZESTORETIC) 10-12.5 MG tablet Take 1 tablet by mouth daily. Patient not taking: Reported on 05/22/2019 05/18/19 06/17/19  Claude Manges, PA-C  omeprazole (PRILOSEC) 20 MG capsule Take 1 capsule (20 mg total) by mouth daily. 07/19/21   Redwine, Madison A, PA-C  sodium chloride (OCEAN) 0.65 % SOLN nasal spray Place 1 spray into both nostrils as needed for congestion. Patient not taking: Reported on 05/22/2019 05/03/19   Roxy Horseman, PA-C      Allergies    Patient has no known allergies.    Review of Systems   Review of Systems  Physical Exam Updated Vital Signs BP 127/89   Pulse (!) 59   Temp 98.2 F (36.8 C) (Oral)   Resp 16   SpO2 95%  Physical Exam Vitals and nursing note reviewed.  Constitutional:      General: He is not in acute distress.    Appearance: He is well-developed.  HENT:     Head: Normocephalic and atraumatic.     Mouth/Throat:     Mouth: Mucous membranes are moist.  Eyes:     General: Vision grossly intact. Gaze aligned appropriately.     Extraocular Movements: Extraocular movements intact.     Conjunctiva/sclera:  Conjunctivae normal.  Cardiovascular:     Rate and Rhythm: Normal rate and regular rhythm.     Pulses: Normal pulses.     Heart sounds: Normal heart sounds, S1 normal and S2 normal. No murmur heard.    No friction rub. No gallop.  Pulmonary:     Effort: Pulmonary effort is normal. No respiratory distress.     Breath sounds: Normal breath sounds.  Abdominal:     Palpations: Abdomen is soft.     Tenderness: There is abdominal tenderness in the epigastric area. There is no guarding or rebound.     Hernia: No hernia is present.  Musculoskeletal:        General: No swelling.     Cervical back: Full passive range of motion without pain, normal range of motion and neck supple. No pain with movement, spinous process tenderness or muscular tenderness. Normal range of motion.     Right lower leg: No edema.     Left lower leg: No edema.  Skin:    General: Skin is warm and dry.     Capillary Refill: Capillary refill takes less than 2 seconds.     Findings: No ecchymosis, erythema, lesion or wound.  Neurological:     Mental Status: He is alert and oriented to person, place, and time.     GCS: GCS eye subscore  is 4. GCS verbal subscore is 5. GCS motor subscore is 6.     Cranial Nerves: Cranial nerves 2-12 are intact.     Sensory: Sensation is intact.     Motor: Motor function is intact. No weakness or abnormal muscle tone.     Coordination: Coordination is intact.  Psychiatric:        Mood and Affect: Mood normal.        Speech: Speech normal.        Behavior: Behavior normal.     ED Results / Procedures / Treatments   Labs (all labs ordered are listed, but only abnormal results are displayed) Labs Reviewed  COMPREHENSIVE METABOLIC PANEL - Abnormal; Notable for the following components:      Result Value   Glucose, Bld 115 (*)    BUN 21 (*)    All other components within normal limits  URINALYSIS, ROUTINE W REFLEX MICROSCOPIC - Abnormal; Notable for the following components:   Hgb  urine dipstick MODERATE (*)    Ketones, ur 5 (*)    Bacteria, UA RARE (*)    All other components within normal limits  LIPASE, BLOOD  CBC    EKG None  Radiology CT ABDOMEN PELVIS W CONTRAST  Result Date: 04/17/2022 CLINICAL DATA:  Abdominal pain at umbilicus for 1-1/2 months. Intermittent nausea and constipation. EXAM: CT ABDOMEN AND PELVIS WITH CONTRAST TECHNIQUE: Multidetector CT imaging of the abdomen and pelvis was performed using the standard protocol following bolus administration of intravenous contrast. RADIATION DOSE REDUCTION: This exam was performed according to the departmental dose-optimization program which includes automated exposure control, adjustment of the mA and/or kV according to patient size and/or use of iterative reconstruction technique. CONTRAST:  OMNIPAQUE IOHEXOL 300 MG/ML  SOLN COMPARISON:  07/19/2021. FINDINGS: Lower chest: Dependent atelectasis is present at the lung bases. Hepatobiliary: Stable scattered hypodensities are present in the liver and too small to further characterize. Gallbladder is without stones. No biliary ductal dilatation. Pancreas: Unremarkable. No pancreatic ductal dilatation or surrounding inflammatory changes. Spleen: Normal in size without focal abnormality. Adrenals/Urinary Tract: No adrenal nodule or mass. The kidneys enhance symmetrically. Cortical scarring is noted on the left. A nonobstructive calculus is present in the lower pole the left kidney. No hydroureteronephrosis bilaterally. The bladder is unremarkable. Stomach/Bowel: There is a small hiatal hernia. There is thickening of the walls of the distal esophagus and proximal stomach. Appendix appears normal. No evidence of bowel wall thickening, distention, or inflammatory changes. No free air or pneumatosis. Vascular/Lymphatic: Aortic atherosclerosis. No enlarged abdominal or pelvic lymph nodes. Reproductive: The prostate gland is mildly enlarged. Other: Small fat containing  umbilical hernia and right inguinal hernia. No abdominopelvic ascites. Musculoskeletal: Mild degenerative changes in the thoracolumbar spine. No acute osseous abnormality. IMPRESSION: 1. No acute intra-abdominal process. 2. Small hiatal hernia with thickening of the walls of the distal esophagus and proximal stomach, possible esophagitis/gastritis. 3. Mildly enlarged prostate gland. 4. Aortic atherosclerosis. Electronically Signed   By: Thornell Sartorius M.D.   On: 04/17/2022 03:35    Procedures Procedures    Medications Ordered in ED Medications  iohexol (OMNIPAQUE) 300 MG/ML solution 100 mL (100 mLs Intravenous Contrast Given 04/17/22 4034)    ED Course/ Medical Decision Making/ A&P                           Medical Decision Making Amount and/or Complexity of Data Reviewed Labs: ordered. Radiology: ordered.  Risk Prescription  drug management.   Patient presents to the emergency department for evaluation of abdominal pain.  Patient has been experiencing abdominal pain, early satiety, sensation of constipation with nausea.  Symptoms ongoing for more than a month.  Differential diagnosis considered includes cholecystitis, gastritis, colitis, tumor, hernia, irritable bowel.  Examination reveals some periumbilical tenderness but no guarding or rebound.  No signs of an acute surgical process.  Lab work unremarkable.  Patient underwent CT to further evaluate.  Patient does have some element of gastritis and esophagitis noted.  No other abnormalities noted.  Discharged with Pepcid, Bentyl.        Final Clinical Impression(s) / ED Diagnoses Final diagnoses:  Periumbilical abdominal pain  Gastritis without bleeding, unspecified chronicity, unspecified gastritis type    Rx / DC Orders ED Discharge Orders     None         Gilda CreasePollina, Dortha Neighbors J, MD 04/17/22 [phone]

## 2022-07-03 ENCOUNTER — Emergency Department (HOSPITAL_COMMUNITY)
Admission: EM | Admit: 2022-07-03 | Discharge: 2022-07-03 | Disposition: A | Discharge status: Home / Self Care | Payer: Self-pay | Attending: Emergency Medicine | Admitting: Emergency Medicine

## 2022-07-03 ENCOUNTER — Ambulatory Visit (HOSPITAL_COMMUNITY)
Admission: EM | Admit: 2022-07-03 | Discharge: 2022-07-03 | Disposition: A | Discharge status: Home / Self Care | Payer: Self-pay | Attending: Nurse Practitioner | Admitting: Nurse Practitioner

## 2022-07-03 ENCOUNTER — Other Ambulatory Visit: Payer: Self-pay

## 2022-07-03 ENCOUNTER — Encounter (HOSPITAL_COMMUNITY): Payer: Self-pay | Admitting: Emergency Medicine

## 2022-07-03 DIAGNOSIS — R1084 Generalized abdominal pain: Secondary | ICD-10-CM | POA: Insufficient documentation

## 2022-07-03 DIAGNOSIS — R140 Abdominal distension (gaseous): Secondary | ICD-10-CM | POA: Insufficient documentation

## 2022-07-03 DIAGNOSIS — R110 Nausea: Secondary | ICD-10-CM | POA: Insufficient documentation

## 2022-07-03 LAB — CBC
HCT: 46.2 % (ref 39.0–52.0)
Hemoglobin: 15.9 g/dL (ref 13.0–17.0)
MCH: 32.3 pg (ref 26.0–34.0)
MCHC: 34.4 g/dL (ref 30.0–36.0)
MCV: 93.9 fL (ref 80.0–100.0)
Platelets: 143 10*3/uL — ABNORMAL LOW (ref 150–400)
RBC: 4.92 MIL/uL (ref 4.22–5.81)
RDW: 13.6 % (ref 11.5–15.5)
WBC: 9.1 10*3/uL (ref 4.0–10.5)
nRBC: 0 % (ref 0.0–0.2)

## 2022-07-03 LAB — URINALYSIS, ROUTINE W REFLEX MICROSCOPIC
Bacteria, UA: NONE SEEN
Bilirubin Urine: NEGATIVE
Glucose, UA: NEGATIVE mg/dL
Hgb urine dipstick: NEGATIVE
Ketones, ur: NEGATIVE mg/dL
Leukocytes,Ua: NEGATIVE
Nitrite: NEGATIVE
Protein, ur: 30 mg/dL — AB
Specific Gravity, Urine: 1.021 (ref 1.005–1.030)
pH: 5 (ref 5.0–8.0)

## 2022-07-03 LAB — COMPREHENSIVE METABOLIC PANEL
ALT: 27 U/L (ref 0–44)
AST: 30 U/L (ref 15–41)
Albumin: 4.2 g/dL (ref 3.5–5.0)
Alkaline Phosphatase: 94 U/L (ref 38–126)
Anion gap: 9 (ref 5–15)
BUN: 15 mg/dL (ref 6–20)
CO2: 21 mmol/L — ABNORMAL LOW (ref 22–32)
Calcium: 8.6 mg/dL — ABNORMAL LOW (ref 8.9–10.3)
Chloride: 108 mmol/L (ref 98–111)
Creatinine, Ser: 1.25 mg/dL — ABNORMAL HIGH (ref 0.61–1.24)
GFR, Estimated: >60 mL/min (ref >60)
Glucose, Bld: 142 mg/dL — ABNORMAL HIGH (ref 70–99)
Potassium: 3.7 mmol/L (ref 3.5–5.1)
Sodium: 138 mmol/L (ref 135–145)
Total Bilirubin: 0.9 mg/dL (ref 0.3–1.2)
Total Protein: 7.2 g/dL (ref 6.5–8.1)

## 2022-07-03 LAB — LIPASE, BLOOD: Lipase: 36 U/L (ref 11–51)

## 2022-07-03 MED ORDER — PANTOPRAZOLE SODIUM 40 MG PO TBEC: 40.0000 mg | DELAYED_RELEASE_TABLET | Freq: Every day | ORAL | 0 refills | Status: DC

## 2022-07-03 MED ORDER — ONDANSETRON 8 MG PO TBDP: 8.0000 mg | ORAL_TABLET | Freq: Two times a day (BID) | ORAL | 0 refills | Status: AC

## 2022-07-03 MED ORDER — ALUM & MAG HYDROXIDE-SIMETH 200-200-20 MG/5ML PO SUSP
30.0000 mL | Freq: Once | ORAL | Status: AC
  Administered 2022-07-03: 30 mL via ORAL
  Filled 2022-07-03: qty 30

## 2022-07-03 NOTE — ED Triage Notes (Signed)
Pt reported to ED with c/o epigastric/upper abdominal pain, distention and nausea that has been occurring intermittently x2 months. Pt states that sometimes he feels "that there is air under his ribs". Denies any chest pain or shortness of breath.

## 2022-07-03 NOTE — ED Provider Notes (Signed)
[facility]    CSN: [id number] Arrival date & time: 07/03/22  1902      History   Chief Complaint Chief Complaint  Patient presents with  . Abdominal Pain    HPI Walter Daniel is a 51 y.o. male.   HPI  He is complaining of nausea and bloating for 2 months. He reports that he was seen yesterday and in the ED and was given Mylanta. He feels like the abdominal pain getting worse. He describes it as sharp stabbing pains that are getting worse.  Past Medical History:  Diagnosis Date  . Alcohol dependence (HCC)   . Hematemesis   . Hypertension     There are no problems to display for this patient.   No past surgical history on file.     Home Medications    Prior to Admission medications   Medication Sig Start Date End Date Taking? Authorizing Provider  ondansetron (ZOFRAN-ODT) 8 MG disintegrating tablet Take 1 tablet (8 mg total) by mouth 2 (two) times daily for 15 days. 07/03/22 07/18/22 Yes Elyssa Pendelton, Shana Chute, NP  pantoprazole (PROTONIX) 40 MG tablet Take 1 tablet (40 mg total) by mouth daily. 07/03/22 08/02/22 Yes Shelonda Saxe, Shana Chute, NP  dicyclomine (BENTYL) 20 MG tablet Take 1 tablet (20 mg total) by mouth 3 (three) times daily before meals. 04/17/22   Gilda Crease, MD    Family History No family history on file.  Social History Social History   Tobacco Use  . Smoking status: Never  . Smokeless tobacco: Never  Substance Use Topics  . Alcohol use: Yes    Comment: Socially   . Drug use: Not Currently     Allergies   Patient has no known allergies.   Review of Systems Review of Systems   Physical Exam Triage Vital Signs ED Triage Vitals  Enc Vitals Group     BP 07/03/22 1952 (!) 184/93     Pulse Rate 07/03/22 1952 85     Resp 07/03/22 1952 18     Temp 07/03/22 1952 98.2 F (36.8 C)     Temp Source 07/03/22 1952 Oral     SpO2 07/03/22 1952 98 %     Weight --      Height --      Head Circumference --      Peak Flow --       Pain Score 07/03/22 1951 6     Pain Loc --      Pain Edu? --      Excl. in GC? --    No data found.  Updated Vital Signs BP (!) 184/93 (BP Location: Left Arm)   Pulse 85   Temp 98.2 F (36.8 C) (Oral)   Resp 18   SpO2 98%   Visual Acuity Right Eye Distance:   Left Eye Distance:   Bilateral Distance:    Right Eye Near:   Left Eye Near:    Bilateral Near:     Physical Exam   UC Treatments / Results  Labs (all labs ordered are listed, but only abnormal results are displayed) Labs Reviewed  H. PYLORI ANTIBODY, IGG - Abnormal; Notable for the following components:      Result Value   H Pylori IgG 4.25 (*)    All other components within normal limits    EKG   Radiology No results found.  Procedures Procedures (including critical care time)  Medications Ordered in UC Medications - No data to display  Initial Impression / Assessment and Plan / UC Course  I have reviewed the triage vital signs and the nursing notes.  Pertinent labs & imaging results that were available during my care of the patient were reviewed by me and considered in my medical decision making (see chart for details).     Abdomianl bloating Final Clinical Impressions(s) / UC Diagnoses   Final diagnoses:  Abdominal bloating  Nausea     Discharge Instructions      H pylori test pending Started on Pantoprazole daily on an empty stomach Zofran 8 mg twice a day       ED Prescriptions     Medication Sig Dispense Auth. Provider   ondansetron (ZOFRAN-ODT) 8 MG disintegrating tablet Take 1 tablet (8 mg total) by mouth 2 (two) times daily for 15 days. 30 tablet Thad Ranger M, NP   pantoprazole (PROTONIX) 40 MG tablet Take 1 tablet (40 mg total) by mouth daily. 30 tablet Barbette Merino, NP      PDMP not reviewed this encounter.   Thad Ranger [place], [place] 07/22/22 [phone]

## 2022-07-03 NOTE — Discharge Instructions (Signed)
Try pepcid or tagamet up to twice a day.  Try to avoid things that may make this worse, most commonly these are spicy foods tomato based products fatty foods chocolate and peppermint.  Alcohol and tobacco can also make this worse.  Return to the emergency department for sudden worsening pain fever or inability to eat or drink.  Pruebe pepcid o tagamet [employer] al da. Trate de evitar cosas que puedan empeorar esto, ms comnmente son alimentos picantes, productos a base de tomate, alimentos grasos, chocolate y [place]. El alcohol y el tabaco tambin pueden empeorar esto. Regrese al departamento de emergencias si el dolor empeora repentinamente, fiebre o incapacidad para comer o beber.  Seguimiento con su mdico de Orthoptist. Si esto contina, es posible que lo deriven a Fut[employer] trader para una evaluacin adicional.  Follow-up with your family doctor in the office.  If this continues they may refer you to a gastroenterologist for further evaluation.

## 2022-07-03 NOTE — ED Provider Notes (Signed)
[facility] EMERGENCY DEPARTMENT Provider Note   CSN: [id number] Arrival date & time: 07/03/22  0215     History  Chief Complaint  Patient presents with   Abdominal Pain    Walter Daniel is a 51 y.o. male.  51 yo M with a chief complaint of abdominal pain.  This been going on for a few days now.  He feels like his abdomen is distended and feels he has pain in a bit around the umbilicus in the epigastrium.  Seems occur with certain foods and drinks.  No fevers.  No trauma.   Abdominal Pain      Home Medications Prior to Admission medications   Medication Sig Start Date End Date Taking? Authorizing Provider  dicyclomine (BENTYL) 20 MG tablet Take 1 tablet (20 mg total) by mouth 3 (three) times daily before meals. 04/17/22   Gilda Crease, MD  omeprazole (PRILOSEC) 20 MG capsule Take 1 capsule (20 mg total) by mouth daily. 04/17/22   Gilda Crease, MD      Allergies    Patient has no known allergies.    Review of Systems   Review of Systems  Gastrointestinal:  Positive for abdominal pain.    Physical Exam Updated Vital Signs BP (!) 188/99   Pulse 69   Temp 97.8 F (36.6 C) (Oral)   Resp 18   SpO2 96%  Physical Exam Vitals and nursing note reviewed.  Constitutional:      Appearance: He is well-developed.  HENT:     Head: Normocephalic and atraumatic.  Eyes:     Pupils: Pupils are equal, round, and reactive to light.  Neck:     Vascular: No JVD.  Cardiovascular:     Rate and Rhythm: Normal rate and regular rhythm.     Heart sounds: No murmur heard.    No friction rub. No gallop.  Pulmonary:     Effort: No respiratory distress.     Breath sounds: No wheezing.  Abdominal:     General: There is no distension.     Tenderness: There is no abdominal tenderness. There is no guarding or rebound.     Comments: Points to the periumbilical region and epigastrium.  No appreciable discomfort on exam.  Negative Murphy sign.   Musculoskeletal:        General: Normal range of motion.     Cervical back: Normal range of motion and neck supple.  Skin:    Coloration: Skin is not pale.     Findings: No rash.  Neurological:     Mental Status: He is alert and oriented to person, place, and time.  Psychiatric:        Behavior: Behavior normal.     ED Results / Procedures / Treatments   Labs (all labs ordered are listed, but only abnormal results are displayed) Labs Reviewed  COMPREHENSIVE METABOLIC PANEL - Abnormal; Notable for the following components:      Result Value   CO2 21 (*)    Glucose, Bld 142 (*)    Creatinine, Ser 1.25 (*)    Calcium 8.6 (*)    All other components within normal limits  CBC - Abnormal; Notable for the following components:   Platelets 143 (*)    All other components within normal limits  URINALYSIS, ROUTINE W REFLEX MICROSCOPIC - Abnormal; Notable for the following components:   Protein, ur 30 (*)    All other components within normal limits  LIPASE, BLOOD  EKG EKG Interpretation  Date/Time:  Friday July 03 2022 02:32:34 EDT Ventricular Rate:  81 PR Interval:  154 QRS Duration: 100 QT Interval:  420 QTC Calculation: 487 R Axis:   102 Text Interpretation: Normal sinus rhythm with sinus arrhythmia Rightward axis Prolonged QT Abnormal ECG No significant change since last tracing Confirmed by Melene Plan [phone]) on 07/03/2022 5:57:12 AM  Radiology No results found.  Procedures Procedures    Medications Ordered in ED Medications  alum & mag hydroxide-simeth (MAALOX/MYLANTA) 200-200-20 MG/5ML suspension 30 mL (has no administration in time range)    ED Course/ Medical Decision Making/ A&P                           Medical Decision Making Amount and/or Complexity of Data Reviewed Labs: ordered.  Risk OTC drugs.   51 yo M with a chief complaint of abdominal discomfort.  This has been going on for a couple months.  He has a benign abdominal exam.  We will  start on H2 blockers.  Have him follow-up with his family doctor in the office.  Patient was actually seen for the same a couple months ago in the emergency department and had CT imaging that was negative.  He was also seen in September of last year and had CT imaging that showed possible esophagitis.    6:27 AM:  I have discussed the diagnosis/risks/treatment options with the patient.  Evaluation and diagnostic testing in the emergency department does not suggest an emergent condition requiring admission or immediate intervention beyond what has been performed at this time.  They will follow up with  PCP. We also discussed returning to the ED immediately if new or worsening sx occur. We discussed the sx which are most concerning (e.g., sudden worsening pain, fever, inability to tolerate by mouth) that necessitate immediate return. Medications administered to the patient during their visit and any new prescriptions provided to the patient are listed below.  Medications given during this visit Medications  alum & mag hydroxide-simeth (MAALOX/MYLANTA) 200-200-20 MG/5ML suspension 30 mL (has no administration in time range)     The patient appears reasonably screen and/or stabilized for discharge and I doubt any other medical condition or other [facility] requiring further screening, evaluation, or treatment in the ED at this time prior to discharge.          Final Clinical Impression(s) / ED Diagnoses Final diagnoses:  Generalized abdominal pain    Rx / DC Orders ED Discharge Orders     None         Melene Plan, DO 07/03/22 [id number]

## 2022-07-03 NOTE — Discharge Instructions (Addendum)
H pylori test pending Started on Pantoprazole daily on an empty stomach Zofran 8 mg twice a day

## 2022-07-03 NOTE — ED Triage Notes (Signed)
Pt reports intermittent upper abdominal pain x 2 months. States he was seen at the ED lastnight and only given mylanta. States the pain is severe and feels like a needle is stabbing him and reports feeling distended.

## 2022-07-06 LAB — H. PYLORI ANTIBODY, IGG: H Pylori IgG: 4.25 Index Value — ABNORMAL HIGH (ref 0.00–0.79)

## 2022-10-21 ENCOUNTER — Encounter (HOSPITAL_COMMUNITY): Payer: Self-pay | Admitting: Emergency Medicine

## 2022-10-21 ENCOUNTER — Emergency Department (HOSPITAL_COMMUNITY)
Admission: EM | Admit: 2022-10-21 | Discharge: 2022-10-21 | Disposition: A | Discharge status: Home / Self Care | Payer: Self-pay | Attending: Emergency Medicine | Admitting: Emergency Medicine

## 2022-10-21 ENCOUNTER — Emergency Department (HOSPITAL_COMMUNITY): Payer: Self-pay

## 2022-10-21 DIAGNOSIS — Z720 Tobacco use: Secondary | ICD-10-CM | POA: Insufficient documentation

## 2022-10-21 DIAGNOSIS — H538 Other visual disturbances: Secondary | ICD-10-CM | POA: Insufficient documentation

## 2022-10-21 DIAGNOSIS — J069 Acute upper respiratory infection, unspecified: Secondary | ICD-10-CM | POA: Insufficient documentation

## 2022-10-21 DIAGNOSIS — G510 Bell's palsy: Secondary | ICD-10-CM | POA: Insufficient documentation

## 2022-10-21 DIAGNOSIS — Z1152 Encounter for screening for COVID-19: Secondary | ICD-10-CM | POA: Insufficient documentation

## 2022-10-21 DIAGNOSIS — I10 Essential (primary) hypertension: Secondary | ICD-10-CM | POA: Insufficient documentation

## 2022-10-21 LAB — DIFFERENTIAL
Abs Immature Granulocytes: 0.01 10*3/uL (ref 0.00–0.07)
Basophils Absolute: 0 10*3/uL (ref 0.0–0.1)
Basophils Relative: 1 %
Eosinophils Absolute: 0.1 10*3/uL (ref 0.0–0.5)
Eosinophils Relative: 1 %
Immature Granulocytes: 0 %
Lymphocytes Relative: 18 %
Lymphs Abs: 1.2 10*3/uL (ref 0.7–4.0)
Monocytes Absolute: 0.8 10*3/uL (ref 0.1–1.0)
Monocytes Relative: 13 %
Neutro Abs: 4.3 10*3/uL (ref 1.7–7.7)
Neutrophils Relative %: 67 %

## 2022-10-21 LAB — PROTIME-INR
INR: 1.1 (ref 0.8–1.2)
Prothrombin Time: 13.8 seconds (ref 11.4–15.2)

## 2022-10-21 LAB — CBC
HCT: 44.6 % (ref 39.0–52.0)
Hemoglobin: 15.8 g/dL (ref 13.0–17.0)
MCH: 31.9 pg (ref 26.0–34.0)
MCHC: 35.4 g/dL (ref 30.0–36.0)
MCV: 90.1 fL (ref 80.0–100.0)
Platelets: 123 10*3/uL — ABNORMAL LOW (ref 150–400)
RBC: 4.95 MIL/uL (ref 4.22–5.81)
RDW: 13.8 % (ref 11.5–15.5)
WBC: 6.4 10*3/uL (ref 4.0–10.5)
nRBC: 0 % (ref 0.0–0.2)

## 2022-10-21 LAB — COMPREHENSIVE METABOLIC PANEL
ALT: 24 U/L (ref 0–44)
AST: 30 U/L (ref 15–41)
Albumin: 3.9 g/dL (ref 3.5–5.0)
Alkaline Phosphatase: 65 U/L (ref 38–126)
Anion gap: 8 (ref 5–15)
BUN: 16 mg/dL (ref 6–20)
CO2: 19 mmol/L — ABNORMAL LOW (ref 22–32)
Calcium: 8.6 mg/dL — ABNORMAL LOW (ref 8.9–10.3)
Chloride: 110 mmol/L (ref 98–111)
Creatinine, Ser: 1 mg/dL (ref 0.61–1.24)
GFR, Estimated: >60 mL/min (ref >60)
Glucose, Bld: 125 mg/dL — ABNORMAL HIGH (ref 70–99)
Potassium: 3.4 mmol/L — ABNORMAL LOW (ref 3.5–5.1)
Sodium: 137 mmol/L (ref 135–145)
Total Bilirubin: 0.4 mg/dL (ref 0.3–1.2)
Total Protein: 7.1 g/dL (ref 6.5–8.1)

## 2022-10-21 LAB — RESP PANEL BY RT-PCR (RSV, FLU A&B, COVID)  RVPGX2
Influenza A by PCR: POSITIVE — AB
Influenza B by PCR: NEGATIVE
Resp Syncytial Virus by PCR: NEGATIVE
SARS Coronavirus 2 by RT PCR: NEGATIVE

## 2022-10-21 LAB — I-STAT CHEM 8, ED
BUN: 17 mg/dL (ref 6–20)
Calcium, Ion: 1.04 mmol/L — ABNORMAL LOW (ref 1.15–1.40)
Chloride: 108 mmol/L (ref 98–111)
Creatinine, Ser: 0.9 mg/dL (ref 0.61–1.24)
Glucose, Bld: 126 mg/dL — ABNORMAL HIGH (ref 70–99)
HCT: 46 % (ref 39.0–52.0)
Hemoglobin: 15.6 g/dL (ref 13.0–17.0)
Potassium: 3.4 mmol/L — ABNORMAL LOW (ref 3.5–5.1)
Sodium: 139 mmol/L (ref 135–145)
TCO2: 18 mmol/L — ABNORMAL LOW (ref 22–32)

## 2022-10-21 LAB — APTT: aPTT: 30 seconds (ref 24–36)

## 2022-10-21 LAB — ETHANOL: Alcohol, Ethyl (B): <10 mg/dL (ref <10)

## 2022-10-21 MED ORDER — SODIUM CHLORIDE 0.9% FLUSH: 3.0000 mL | Freq: Once | INTRAVENOUS | Status: DC

## 2022-10-21 MED ORDER — VALACYCLOVIR HCL 1 G PO TABS: 1000.0000 mg | ORAL_TABLET | Freq: Three times a day (TID) | ORAL | 0 refills | Status: AC

## 2022-10-21 MED ORDER — PREDNISONE 20 MG PO TABS: 40.0000 mg | ORAL_TABLET | Freq: Every day | ORAL | 0 refills | Status: AC

## 2022-10-21 NOTE — ED Provider Notes (Signed)
[facility] EMERGENCY DEPARTMENT Provider Note   CSN: [id number] Arrival date & time: 10/21/22  1414     History  Chief Complaint  Patient presents with   Facial Droop   Blurred Vision    [facility] Walter Daniel is a 51 y.o. male.  Patient is a 51 year old male with a history of hypertension currently on 2 different antihypertensives and a prior history of alcohol dependence but is no longer drinking and tobacco use who is presenting today with multiple complaints.  Patient noticed on Sunday he started to have drooping of the right side of his face with some twitching of his eye.  Yesterday he felt like it got worse and in addition he developed a cough, headache, chills.  He notes when he drinks something at the water will come out of the side of his mouth and his eye will not always close all the way.  He has not had any difficulty with his speech or vision.  No weakness or numbness of his arms or legs.  He just reports feeling overall unwell.  He did not check his temperature but his wife reports that he was very hot yesterday.  He denies any shortness of breath or known sick contacts.  No vomiting or diarrhea.  No significant abdominal pain.  The history is provided by the patient and the spouse. The history is limited by a language barrier. A language interpreter was used (spanish).       Home Medications Prior to Admission medications   Medication Sig Start Date End Date Taking? Authorizing Provider  omeprazole (PRILOSEC) 20 MG capsule Take 20 mg by mouth daily.   Yes [provider]  predniSONE (DELTASONE) 20 MG tablet Take 2 tablets (40 mg total) by mouth daily. 10/21/22  Yes Efrat Zuidema, Alphonzo Lemmings, MD  valACYclovir (VALTREX) 1000 MG tablet Take 1 tablet (1,000 mg total) by mouth 3 (three) times daily. 10/21/22  Yes Gwyneth Sprout, MD  dicyclomine (BENTYL) 20 MG tablet Take 1 tablet (20 mg total) by mouth 3 (three) times daily before  meals. Patient not taking: Reported on 10/21/2022 04/17/22   Gilda Crease, MD  pantoprazole (PROTONIX) 40 MG tablet Take 1 tablet (40 mg total) by mouth daily. Patient not taking: Reported on 10/21/2022 07/03/22 08/02/22  Barbette Merino, NP      Allergies    Patient has no known allergies.    Review of Systems   Review of Systems  Physical Exam Updated Vital Signs BP (!) 153/87 (BP Location: Right Arm)   Pulse 75   Temp 99.1 F (37.3 C) (Oral)   Resp 16   Ht 5\' 9"  (1.753 m)   Wt 91 kg   SpO2 97%   BMI 29.63 kg/m  Physical Exam Vitals and nursing note reviewed.  Constitutional:      General: He is not in acute distress.    Appearance: He is well-developed.  HENT:     Head: Normocephalic and atraumatic.     Right Ear: Tympanic membrane normal.     Left Ear: Tympanic membrane normal.  Eyes:     General: No visual field deficit.    Conjunctiva/sclera: Conjunctivae normal.     Pupils: Pupils are equal, round, and reactive to light.  Cardiovascular:     Rate and Rhythm: Normal rate and regular rhythm.     Heart sounds: No murmur heard. Pulmonary:     Effort: Pulmonary effort is normal. No respiratory distress.  Breath sounds: Normal breath sounds. No wheezing or rales.  Abdominal:     General: There is no distension.     Palpations: Abdomen is soft.     Tenderness: There is no abdominal tenderness. There is no guarding or rebound.  Musculoskeletal:        General: No tenderness. Normal range of motion.     Cervical back: Normal range of motion and neck supple.  Skin:    General: Skin is warm and dry.     Findings: No erythema or rash.  Neurological:     Mental Status: He is alert and oriented to person, place, and time.     Cranial Nerves: Cranial nerve deficit and facial asymmetry present. No dysarthria.     Sensory: Sensation is intact.     Motor: Motor function is intact. No weakness or pronator drift.     Coordination: Coordination is intact.      Gait: Gait is intact.     Comments: Right-sided facial droop including the forehead.  Unable to raise the right eyebrow.  Weak eyelid closure and right-sided mouth droop.  Psychiatric:        Behavior: Behavior normal.     ED Results / Procedures / Treatments   Labs (all labs ordered are listed, but only abnormal results are displayed) Labs Reviewed  COMPREHENSIVE METABOLIC PANEL - Abnormal; Notable for the following components:      Result Value   Potassium 3.4 (*)    CO2 19 (*)    Glucose, Bld 125 (*)    Calcium 8.6 (*)    All other components within normal limits  CBC - Abnormal; Notable for the following components:   Platelets 123 (*)    All other components within normal limits  I-STAT CHEM 8, ED - Abnormal; Notable for the following components:   Potassium 3.4 (*)    Glucose, Bld 126 (*)    Calcium, Ion 1.04 (*)    TCO2 18 (*)    All other components within normal limits  RESP PANEL BY RT-PCR (RSV, FLU A&B, COVID)  RVPGX2  PROTIME-INR  APTT  DIFFERENTIAL  ETHANOL  CBG MONITORING, ED    EKG EKG Interpretation  Date/Time:  Wednesday October 21 2022 14:54:40 EST Ventricular Rate:  89 PR Interval:  144 QRS Duration: 106 QT Interval:  394 QTC Calculation: 479 R Axis:   127 Text Interpretation: Normal sinus rhythm Left posterior fascicular block Cannot rule out Inferior infarct , age undetermined No significant change since last tracing When compared with ECG of 03-Jul-2022 02:32, PREVIOUS ECG IS PRESENT Confirmed by Gwyneth Sprout ([id number]) on 10/21/2022 5:37:19 PM  Radiology CT HEAD WO CONTRAST  Result Date: 10/21/2022 CLINICAL DATA:  Headaches, right facial droop EXAM: CT HEAD WITHOUT CONTRAST TECHNIQUE: Contiguous axial images were obtained from the base of the skull through the vertex without intravenous contrast. RADIATION DOSE REDUCTION: This exam was performed according to the departmental dose-optimization program which includes automated exposure  control, adjustment of the mA and/or kV according to patient size and/or use of iterative reconstruction technique. COMPARISON:  None Available. FINDINGS: Brain: No acute intracranial findings are seen. There are no signs of bleeding within the cranium. Ventricles are not dilated. There is no focal edema or mass effect. Vascular: Unremarkable. Skull: Unremarkable. Sinuses/Orbits: There is mucosal thickening in the ethmoid sinus. Mucous retention cyst is seen in right maxillary sinus. There is mild deviation of nasal septum to the left. Other: None IMPRESSION: No acute intracranial findings  are seen in noncontrast CT brain. Chronic sinusitis. Electronically Signed   By: Ernie Avena M.D.   On: 10/21/2022 15:39    Procedures Procedures    Medications Ordered in ED Medications  sodium chloride flush (NS) 0.9 % injection 3 mL (3 mLs Intravenous Not Given 10/21/22 1716)    ED Course/ Medical Decision Making/ A&P                           Medical Decision Making Amount and/or Complexity of Data Reviewed Labs: ordered. Decision-making details documented in ED Course. Radiology: ordered and independent interpretation performed. Decision-making details documented in ED Course. ECG/medicine tests: ordered and independent interpretation performed. Decision-making details documented in ED Course.  Risk Prescription drug management.   Pt with multiple medical problems and comorbidities and presenting today with a complaint that caries a high risk for morbidity and mortality.  Here today with viral URI type symptoms as well as right-sided facial droop most consistent with Bell's palsy.  Patient has involvement of the forehead and low suspicion for stroke at this time.  He does complain of URI symptoms but denies any shortness of breath is comfortable appearing on exam.  Patient is no longer drinking and low suspicion for a complication from alcohol use.  He has no evidence of fluid overload today  and denies any chest pain or abdominal pain.  Patient's breath sounds are clear and low suspicion for pneumonia at this time.  No evidence of zoster present on exam.  Discussed with the patient and his wife who is present on exam his lab work which I independently interpreted with a CMP without acute findings, EtOH negative, normal coags, CBC within normal limits.  I independently interpreted patient's EKG today and it showed no acute findings except for ventricular hypertrophy which is unchanged.  I have independently visualized and interpreted pt's images today.  Head CT is negative for stroke or acute bleed. Patient will be checked for COVID flu and RSV and will follow-up with his results at home.  Discussed with him and his wife treatment for Bell's palsy and keeping the eye lubricated.  He will follow-up with his PCP.  Patient was noted to be hypertensive here but is currently taking 2 different blood pressure medications and will follow-up with his doctor if it continues to be elevated.         Final Clinical Impression(s) / ED Diagnoses Final diagnoses:  Bell's palsy  Viral upper respiratory tract infection    Rx / DC Orders ED Discharge Orders          Ordered    valACYclovir (VALTREX) 1000 MG tablet  3 times daily        10/21/22 1820    predniSONE (DELTASONE) 20 MG tablet  Daily        10/21/22 1820              Gwyneth Sprout, MD 10/21/22 Rickey Primus

## 2022-10-21 NOTE — ED Triage Notes (Signed)
Using interpreter, pt reports fever, headache and facial droop since Sunday. Denies trouble breathing. No weakness noted. Pt hypertensive, states he has taken his meds today. States he has had intermittent blurry vision.

## 2022-10-21 NOTE — Discharge Instructions (Addendum)
Your COVID, flu and RSV results should be back later this evening and will be available on your MyChart account.  Take Tylenol as needed for fever and chills.  Make sure you are staying hydrated by drinking plenty of fluids.  When you are at the pharmacy get some lubricating eyedrops to use on your right eye during the day to avoid it from getting dried out and lubricating eye ointment for night.

## 2022-10-21 NOTE — ED Provider Triage Note (Signed)
Emergency Medicine Provider Triage Evaluation Note  [facility] , a 51 y.o. male  was evaluated in triage.  Pt complains of headache, fever, facial droop and slurring of speech that began on Sunday.  Patient states it is difficult to eat, but denies difficulty breathing.  Denies chest pain, shortness of breath, unilateral weakness.  Hx significant for HTN.    Interpreter was used for HPI.   Review of Systems  Positive: See above  Negative: See above  Physical Exam  BP (!) 200/116 (BP Location: Right Arm)   Pulse 93   Temp (!) 97.2 F (36.2 C) (Oral)   Resp 18   Ht 5\' 9"  (1.753 m)   Wt 91 kg   SpO2 97%   BMI 29.63 kg/m  Gen:   Awake, no distress   Resp:  Normal effort  MSK:   Moves extremities without difficulty  Other:  Right side facial droop including forehead, right eye ptosis, normal sensation of face and extremities; 5/5 strength bilaterally upper and lower extremities; patient was observed ambulating into triage without abnormal gait  Medical Decision Making  Medically screening exam initiated at 2:34 PM.  Appropriate orders placed.  [facility] [place] was informed that the remainder of the evaluation will be completed by another provider, this initial triage assessment does not replace that evaluation, and the importance of remaining in the ED until their evaluation is complete.  Ordered stroke orders, not a CODE STROKE due to being outside of window, sxs since Sunday    Sunday Bassett, [place] 10/21/22 1440

## 2023-06-06 ENCOUNTER — Encounter (HOSPITAL_COMMUNITY): Payer: Self-pay

## 2023-06-06 ENCOUNTER — Emergency Department (HOSPITAL_COMMUNITY)
Admission: EM | Admit: 2023-06-06 | Discharge: 2023-06-07 | Disposition: A | Discharge status: Home / Self Care | Payer: Self-pay | Attending: Emergency Medicine | Admitting: Emergency Medicine

## 2023-06-06 ENCOUNTER — Other Ambulatory Visit: Payer: Self-pay

## 2023-06-06 DIAGNOSIS — I10 Essential (primary) hypertension: Secondary | ICD-10-CM | POA: Insufficient documentation

## 2023-06-06 DIAGNOSIS — R140 Abdominal distension (gaseous): Secondary | ICD-10-CM

## 2023-06-06 DIAGNOSIS — N201 Calculus of ureter: Secondary | ICD-10-CM | POA: Insufficient documentation

## 2023-06-06 LAB — URINALYSIS, ROUTINE W REFLEX MICROSCOPIC
Bilirubin Urine: NEGATIVE
Glucose, UA: NEGATIVE mg/dL
Ketones, ur: 5 mg/dL — AB
Leukocytes,Ua: NEGATIVE
Nitrite: NEGATIVE
Protein, ur: 100 mg/dL — AB
RBC / HPF: >50 RBC/hpf (ref 0–5)
Specific Gravity, Urine: 1.024 (ref 1.005–1.030)
pH: 6 (ref 5.0–8.0)

## 2023-06-06 LAB — CBC WITH DIFFERENTIAL/PLATELET
Abs Immature Granulocytes: 0.02 10*3/uL (ref 0.00–0.07)
Basophils Absolute: 0 10*3/uL (ref 0.0–0.1)
Basophils Relative: 0 %
Eosinophils Absolute: 0.2 10*3/uL (ref 0.0–0.5)
Eosinophils Relative: 2 %
HCT: 45.5 % (ref 39.0–52.0)
Hemoglobin: 15.3 g/dL (ref 13.0–17.0)
Immature Granulocytes: 0 %
Lymphocytes Relative: 25 %
Lymphs Abs: 2.4 10*3/uL (ref 0.7–4.0)
MCH: 31.7 pg (ref 26.0–34.0)
MCHC: 33.6 g/dL (ref 30.0–36.0)
MCV: 94.2 fL (ref 80.0–100.0)
Monocytes Absolute: 0.6 10*3/uL (ref 0.1–1.0)
Monocytes Relative: 6 %
Neutro Abs: 6.4 10*3/uL (ref 1.7–7.7)
Neutrophils Relative %: 67 %
Platelets: 150 10*3/uL (ref 150–400)
RBC: 4.83 MIL/uL (ref 4.22–5.81)
RDW: 13.9 % (ref 11.5–15.5)
WBC: 9.6 10*3/uL (ref 4.0–10.5)
nRBC: 0 % (ref 0.0–0.2)

## 2023-06-06 LAB — COMPREHENSIVE METABOLIC PANEL
ALT: 32 U/L (ref 0–44)
AST: 28 U/L (ref 15–41)
Albumin: 4.5 g/dL (ref 3.5–5.0)
Alkaline Phosphatase: 84 U/L (ref 38–126)
Anion gap: 11 (ref 5–15)
BUN: 13 mg/dL (ref 6–20)
CO2: 22 mmol/L (ref 22–32)
Calcium: 9.1 mg/dL (ref 8.9–10.3)
Chloride: 106 mmol/L (ref 98–111)
Creatinine, Ser: 0.96 mg/dL (ref 0.61–1.24)
GFR, Estimated: >60 mL/min (ref >60)
Glucose, Bld: 100 mg/dL — ABNORMAL HIGH (ref 70–99)
Potassium: 3.5 mmol/L (ref 3.5–5.1)
Sodium: 139 mmol/L (ref 135–145)
Total Bilirubin: 0.9 mg/dL (ref 0.3–1.2)
Total Protein: 7.7 g/dL (ref 6.5–8.1)

## 2023-06-06 LAB — LIPASE, BLOOD: Lipase: 31 U/L (ref 11–51)

## 2023-06-06 NOTE — ED Triage Notes (Signed)
Spanish interpreter used for triage: Pt reports abd pain for 3 months that radiates to his lower back and hips. Pain radiates from one side to the other. Pt also reports severe nausea with some burning. Pt has not drank alcohol in over a year, pain gets worse when he eats.

## 2023-06-06 NOTE — ED Provider Triage Note (Signed)
Emergency Medicine Provider Triage Evaluation Note  [facility] [place] , a 52 y.o. male  was evaluated in triage.  Pt complains of epigastric and lower abdominal pain.  Patient has a history of alcohol dependence.  He has not had any alcohol for the past 6 years.  He is here complaining of several weeks of abdominal pain worse in his epigastrium and lower abdomen.  He has had decreased appetite.  He feels like his abdomen is swollen in the morning.  He also has some burning with urination..  Review of Systems  Positive: Abdominal pain Negative: Change in urine or bowel  Physical Exam  BP (!) 199/116   Pulse 73   Temp 98 F (36.7 C)   Resp 16   SpO2 100%  Gen:   Awake, no distress   Resp:  Normal effort  MSK:   Moves extremities without difficulty  Other:    Medical Decision Making  Medically screening exam initiated at 7:31 PM.  Appropriate orders placed.  [facility] Walter Daniel was informed that the remainder of the evaluation will be completed by another provider, this initial triage assessment does not replace that evaluation, and the importance of remaining in the ED until their evaluation is complete.     Arthor Captain, PA-C 06/06/23 1932

## 2023-06-07 ENCOUNTER — Emergency Department (HOSPITAL_COMMUNITY): Payer: Self-pay

## 2023-06-07 MED ORDER — SODIUM CHLORIDE 0.9 % IV BOLUS
1000.0000 mL | Freq: Once | INTRAVENOUS | Status: AC
  Administered 2023-06-07: 1000 mL via INTRAVENOUS

## 2023-06-07 MED ORDER — PANTOPRAZOLE SODIUM 40 MG PO TBEC: 40.0000 mg | DELAYED_RELEASE_TABLET | Freq: Every day | ORAL | 1 refills | Status: AC

## 2023-06-07 MED ORDER — LISINOPRIL 10 MG PO TABS
10.0000 mg | ORAL_TABLET | Freq: Once | ORAL | Status: AC
  Administered 2023-06-07: 10 mg via ORAL
  Filled 2023-06-07: qty 1

## 2023-06-07 MED ORDER — IOHEXOL 350 MG/ML SOLN
75.0000 mL | Freq: Once | INTRAVENOUS | Status: AC | PRN
  Administered 2023-06-07: 75 mL via INTRAVENOUS

## 2023-06-07 MED ORDER — DICYCLOMINE HCL 10 MG PO CAPS
10.0000 mg | ORAL_CAPSULE | Freq: Once | ORAL | Status: AC
  Administered 2023-06-07: 10 mg via ORAL
  Filled 2023-06-07: qty 1

## 2023-06-07 MED ORDER — FAMOTIDINE IN NACL 20-0.9 MG/50ML-% IV SOLN
20.0000 mg | Freq: Once | INTRAVENOUS | Status: AC
  Administered 2023-06-07: 20 mg via INTRAVENOUS
  Filled 2023-06-07: qty 50

## 2023-06-07 MED ORDER — PANTOPRAZOLE SODIUM 40 MG IV SOLR
40.0000 mg | Freq: Once | INTRAVENOUS | Status: AC
  Administered 2023-06-07: 40 mg via INTRAVENOUS
  Filled 2023-06-07: qty 10

## 2023-06-07 MED ORDER — TAMSULOSIN HCL 0.4 MG PO CAPS: 0.4000 mg | ORAL_CAPSULE | Freq: Every day | ORAL | 0 refills | Status: DC

## 2023-06-07 MED ORDER — KETOROLAC TROMETHAMINE 15 MG/ML IJ SOLN
15.0000 mg | Freq: Once | INTRAMUSCULAR | Status: AC
  Administered 2023-06-07: 15 mg via INTRAVENOUS
  Filled 2023-06-07: qty 1

## 2023-06-07 NOTE — ED Provider Notes (Signed)
[place] EMERGENCY DEPARTMENT AT [facility] Provider Note   CSN: [id number] Arrival date & time: 06/06/23  1846     History  Chief Complaint  Patient presents with   Abdominal Pain   Back Pain    Walter Daniel is a 52 y.o. male.  52 year old male with a history of hypertension, alcohol dependence presents to the emergency department for evaluation of abdominal pain.  He has been experiencing 2 months of abdominal pain and bloating with early satiety.  States that he cannot eat very much because even a small amount of food will make him feel full and cause worsening bloating.  He has had some associated nausea without vomiting.  His last bowel movement was a few hours ago.  He has not had any melena or hematochezia associated with his symptoms, but reports that sometimes his stool will be very watery.  He did try an OTC "bowel cleanse" type of medication x 7 days w/o relief (doesn't remember medication name).  He denies fevers, but sometimes his abdominal pain is very severe and colicky and makes him sweat.  No prior abdominal surgeries.  On chart review, did test positive for H pylori in August 2023. Reports receiving medications for treatment a few months later which he took for 2 weeks. His pain improved for ~1 month and then returned. Subjectively feels the pain is worse now.  The history is provided by the patient. A language interpreter was used.  Abdominal Pain Back Pain Associated symptoms: abdominal pain        Home Medications Prior to Admission medications   Medication Sig Start Date End Date Taking? Authorizing Provider  tamsulosin (FLOMAX) 0.4 MG CAPS capsule Take 1 capsule (0.4 mg total) by mouth daily. 06/07/23  Yes Antony Madura, PA-C  pantoprazole (PROTONIX) 40 MG tablet Take 1 tablet (40 mg total) by mouth daily. 06/07/23 08/06/23  Antony Madura, PA-C  predniSONE (DELTASONE) 20 MG tablet Take 2 tablets (40 mg total) by mouth daily. 10/21/22    Gwyneth Sprout, MD  valACYclovir (VALTREX) 1000 MG tablet Take 1 tablet (1,000 mg total) by mouth 3 (three) times daily. 10/21/22   Gwyneth Sprout, MD      Allergies    Patient has no known allergies.    Review of Systems   Review of Systems  Gastrointestinal:  Positive for abdominal pain.  Musculoskeletal:  Positive for back pain.  Ten systems reviewed and are negative for acute change, except as noted in the HPI.    Physical Exam Updated Vital Signs BP (!) 151/106   Pulse (!) 54   Temp 98.5 F (36.9 C) (Oral)   Resp 17   SpO2 96%   Physical Exam Vitals and nursing note reviewed.  Constitutional:      General: He is not in acute distress.    Appearance: He is well-developed. He is not diaphoretic.     Comments: Nontoxic-appearing and in no acute distress  HENT:     Head: Normocephalic and atraumatic.  Eyes:     General: No scleral icterus.    Conjunctiva/sclera: Conjunctivae normal.  Pulmonary:     Effort: Pulmonary effort is normal. No respiratory distress.  Abdominal:     Comments: Abdomen soft, obese, mildly distended.  There is no pulsatile mass or peritoneal signs on exam.  Abdomen is generally tender; no focality.  Musculoskeletal:        General: Normal range of motion.     Cervical back: Normal range  of motion.  Skin:    General: Skin is warm and dry.     Coloration: Skin is not pale.     Findings: No erythema or rash.  Neurological:     Mental Status: He is alert and oriented to person, place, and time.     Coordination: Coordination normal.  Psychiatric:        Behavior: Behavior normal.     ED Results / Procedures / Treatments   Labs (all labs ordered are listed, but only abnormal results are displayed) Labs Reviewed  COMPREHENSIVE METABOLIC PANEL - Abnormal; Notable for the following components:      Result Value   Glucose, Bld 100 (*)    All other components within normal limits  URINALYSIS, ROUTINE W REFLEX MICROSCOPIC - Abnormal;  Notable for the following components:   Color, Urine AMBER (*)    APPearance HAZY (*)    Hgb urine dipstick LARGE (*)    Ketones, ur 5 (*)    Protein, ur 100 (*)    Bacteria, UA RARE (*)    All other components within normal limits  CBC WITH DIFFERENTIAL/PLATELET  LIPASE, BLOOD  H. PYLORI ANTIGEN, STOOL    EKG None  Radiology CT ABDOMEN PELVIS W CONTRAST  Result Date: 06/07/2023 CLINICAL DATA:  Abdominal pain for 3 months radiating to the lower back and hips. Severe nausea. EXAM: CT ABDOMEN AND PELVIS WITH CONTRAST TECHNIQUE: Multidetector CT imaging of the abdomen and pelvis was performed using the standard protocol following bolus administration of intravenous contrast. RADIATION DOSE REDUCTION: This exam was performed according to the departmental dose-optimization program which includes automated exposure control, adjustment of the mA and/or kV according to patient size and/or use of iterative reconstruction technique. CONTRAST:  75mL OMNIPAQUE IOHEXOL 350 MG/ML SOLN COMPARISON:  CT abdomen and pelvis 04/17/2022 FINDINGS: Lower chest: No acute abnormality. Hepatobiliary: Unremarkable liver. Normal gallbladder. No biliary dilation. Pancreas: Unremarkable. Spleen: Unremarkable. Adrenals/Urinary Tract: Normal adrenal glands. 5 mm stone in the proximal left ureter. Mild hydroureter in this area. No significant hydronephrosis. No evidence of obstruction on delayed images. No right urinary calculi. Bladder is unremarkable. Stomach/Bowel: Normal caliber large and small bowel. No bowel wall thickening. The appendix is normal.Stomach is within normal limits. Vascular/Lymphatic: Aortic atherosclerosis. No enlarged abdominal or pelvic lymph nodes. Reproductive: Unremarkable. Other: No free intraperitoneal fluid or air. Musculoskeletal: No acute fracture. IMPRESSION: 1. 5 mm stone in the proximal left ureter with mild hydroureter in this area. No significant hydronephrosis. Aortic Atherosclerosis  (ICD10-I70.0). Electronically Signed   By: Minerva Fester M.D.   On: 06/07/2023 02:17    Procedures Procedures    Medications Ordered in ED Medications  lisinopril (ZESTRIL) tablet 10 mg (10 mg Oral Given 06/07/23 0040)  famotidine (PEPCID) IVPB 20 mg premix (0 mg Intravenous Stopped 06/07/23 0114)  pantoprazole (PROTONIX) injection 40 mg (40 mg Intravenous Given 06/07/23 0041)  sodium chloride 0.9 % bolus 1,000 mL (0 mLs Intravenous Stopped 06/07/23 0259)  dicyclomine (BENTYL) capsule 10 mg (10 mg Oral Given 06/07/23 0040)  iohexol (OMNIPAQUE) 350 MG/ML injection 75 mL (75 mLs Intravenous Contrast Given 06/07/23 0152)  ketorolac (TORADOL) 15 MG/ML injection 15 mg (15 mg Intravenous Given 06/07/23 0502)    ED Course/ Medical Decision Making/ A&P                             Medical Decision Making Amount and/or Complexity of Data Reviewed Labs: ordered. Radiology: ordered.  Risk Prescription drug management.   This patient presents to the ED for concern of abdominal pain x2 months, this involves an extensive number of treatment options, and is a complaint that carries with it a high risk of complications and morbidity.  The differential diagnosis includes PUD vs H. Pylori vs constipation vs biliary colic vs pSBO/SBO vs IBD vs IBS   Co morbidities that complicate the patient evaluation  HTN Alcohol dependence   Additional history obtained:  External records from outside source obtained and reviewed including positive H. Pylori IgG in August 2023.   Lab Tests:  I Ordered, and personally interpreted labs.  The pertinent results include:  CBC, CMP, lipase normal. UA with >50 RBCs.   Imaging Studies ordered:  I ordered imaging studies including CT abdomen/pelvis  I independently visualized and interpreted imaging which showed 5mm proximal ureteral stone on the left. No other acute pathology I agree with the radiologist interpretation   Cardiac Monitoring:  The patient  was maintained on a cardiac monitor.  I personally viewed and interpreted the cardiac monitored which showed an underlying rhythm of: NSR   Medicines ordered and prescription drug management:  I ordered medication including Lisinopril for HTN as well as Protonix, pepcid, Bentyl and Toradol for abdominal pain  Reevaluation of the patient after these medicines showed that the patient improved I have reviewed the patients home medicines and have made adjustments as needed   Test Considered:  H. Pylori, stool   Problem List / ED Course:  Patient senting for evaluation of abdominal pain.  This has been present for the past 2 months.  He complains of bloating and early satiety.  No associated vomiting.  Does have a history of treatment for H. pylori.  States that these symptoms recurred about 1 month following treatment completion.  He has never followed up with a specialist for these complaints. No signs of acute surgical abdomen on bedside exam.  The patient is overall well-appearing.  He is afebrile.  No leukocytosis or vital signs to raise concern for SIRS/sepsis. Laboratory workup is normal aside from hematuria noted on urinalysis. CT obtained for further evaluation of symptoms.  This shows only a new obstructing proximal left ureteral stone.  This is consistent with findings of hematuria today.  Kidney function is preserved.  There is no significant hydronephrosis on the left.  Do not feel that the patient's kidney stone is contributing to his primary complaint, especially given chronicity.  It would be unlikely that a kidney stone would be impacting abdominal fullness after eating and bloating.  Will refer to urology to ensure stone passage given 5 mm size. Plan to also refer to gastroenterology for further evaluation of patient's ongoing abdominal pain complaints.  In light of symptom chronicity, reassuring evaluation, do not feel further emergent workup is presently indicated.  Will continue  on Protonix until patient is able to see GI.   Reevaluation:  After the interventions noted above, I reevaluated the patient and found that they have :improved   Social Determinants of Health:  Language barrier   Dispostion:  After consideration of the diagnostic results and the patients response to treatment, I feel that the patent would benefit from f/u with gastroenterology and urology. Given Rx for Protonix to manage abdominal pain/bloating and Flomax to promote ureteral stone movement. Return precautions discussed and provided. Patient discharged in stable condition with no unaddressed concerns.          Final Clinical Impression(s) / ED Diagnoses Final  diagnoses:  Abdominal bloating  Ureterolithiasis  Essential hypertension    Rx / DC Orders ED Discharge Orders          Ordered    Ambulatory referral to Urology        06/07/23 0416    Ambulatory referral to Gastroenterology        06/07/23 0416    pantoprazole (PROTONIX) 40 MG tablet  Daily        06/07/23 0417    tamsulosin (FLOMAX) 0.4 MG CAPS capsule  Daily        06/07/23 0418              Antony Madura, PA-C 06/07/23 7829    Gilda Crease, MD 06/07/23 [phone]

## 2023-06-07 NOTE — Discharge Instructions (Addendum)
Your evaluation in the ED today was reassuring.  You were found to have a kidney stone on the left side.  We recommend follow-up with urology to ensure passage of this stone; call later this morning to make an appointment to be seen in the office.  Take Flomax as prescribed to try and promote stone movement.  With regards to your ongoing abdominal pain and bloating, we recommend Protonix as prescribed daily.  Try to limit your intake of spicy foods, citrus fruits, alcohol as this can aggravate your symptoms.  Seeing that this is an ongoing issue, we have provided a referral to gastroenterology.  Call to schedule an appointment with a provider.  You may return to the ED for new or concerning symptoms.

## 2023-06-07 NOTE — ED Notes (Addendum)
Patient using bathroom at this time.

## 2023-08-24 ENCOUNTER — Encounter: Payer: Self-pay | Admitting: Gastroenterology

## 2023-09-01 ENCOUNTER — Encounter: Payer: Self-pay | Admitting: Physician Assistant

## 2023-11-24 ENCOUNTER — Ambulatory Visit: Payer: Self-pay | Admitting: Gastroenterology

## 2023-11-24 NOTE — Progress Notes (Deleted)
   HPI : Walter Daniel is a 53 y.o. male who is referred to [place] by No ref. provider found.  Of note, his electronic medical record is duplicated.  Most of his medical history is under the name Walter Daniel  CT Abdomen/Pelvis July 2024 IMPRESSION: 1. 5 mm stone in the proximal left ureter with mild hydroureter in this area. No significant hydronephrosis  No past medical history on file.   *** The histories are not reviewed yet. Please review them in the "History" navigator section and refresh this SmartLink. No family history on file.   No current outpatient medications on file.   No current facility-administered medications for this visit.   Not on File   Review of Systems: All systems reviewed and negative except where noted in HPI.    No results found.  Physical Exam: There were no vitals taken for this visit. Constitutional: Pleasant,well-developed, ***male in no acute distress. HEENT: Normocephalic and atraumatic. Conjunctivae are normal. No scleral icterus. Neck supple.  Cardiovascular: Normal rate, regular rhythm.  Pulmonary/chest: Effort normal and breath sounds normal. No wheezing, rales or rhonchi. Abdominal: Soft, nondistended, nontender. Bowel sounds active throughout. There are no masses palpable. No hepatomegaly. Extremities: no edema Lymphadenopathy: No cervical adenopathy noted. Neurological: Alert and oriented to person place and time. Skin: Skin is warm and dry. No rashes noted. Psychiatric: Normal mood and affect. Behavior is normal.  CBC No results found for: "WBC", "RBC", "HGB", "HCT", "PLT", "MCV", "MCH", "MCHC", "RDW", "LYMPHSABS", "MONOABS", "EOSABS", "BASOSABS"  CMP  No results found for: "NA", "K", "CL", "CO2", "GLUCOSE", "BUN", "CREATININE", "CALCIUM", "PROT", "ALBUMIN", "AST", "ALT", "ALKPHOS", "BILITOT", "GFRNONAA", "GFRAA"      No data to display            ASSESSMENT AND PLAN:  No ref. provider found

## 2023-12-02 NOTE — Progress Notes (Deleted)
 12/02/2023 Walter Daniel Walter Daniel [id number] December 08, 1970  Referring provider: [facility]* Primary GI doctor: {acdocs:27040}  ASSESSMENT AND PLAN:   Assessment and Plan              Patient Care Team: [facility] as PCP - General Patient, No Pcp Per (General Practice)  HISTORY OF PRESENT ILLNESS: 53 y.o. Spanish-speaking male with a past medical history of hypertension, alcohol dependence, hematemesis and others listed below presents for evaluation of ***.   Patient's had multiple CT abdomen pelvis 1 yearly since 2022 for abdominal pain 2023 and 2022 patient had distal esophageal wall thickening and signs of small hiatal hernia with gastritis. He tested positive for IgG H. pylori, had negative breath test 11/25/2023. 11/25/2023 labs reviewed did show thrombocytopenia platelets 143, reviewing he has had intermittent thrombocytopenia since 2022.  No anemia normal white blood cell count.  Normal AST, ALT, alk phos and total bilirubin, normal kidney function.  Negative hepatitis C antibody negative HIV normal thyroid Most recent CT 06/07/2023 show unremarkable liver normal gallbladder no biliary dilation unremarkable spleen no signs of portal hypertension.  Discussed the use of AI scribe software for clinical note transcription with the patient, who gave verbal consent to proceed.  History of Present Illness             He  reports that he has never smoked. He has never used smokeless tobacco. He reports current alcohol use. He reports that he does not currently use drugs.  RELEVANT GI HISTORY, LABS, IMAGING: 06/07/2023 CT abdomen pelvis with contrast for abdominal pain and nausea 5 mm stone in the proximal left ureter with mild hydroureter in this area. No significant hydronephrosis. Aortic Atherosclerosis (ICD10-I70.0). 04/17/2022 CT abdomen pelvis with contrast abdominal pain umbilicus intermittent nausea and constipation 1. No acute  intra-abdominal process. 2. Small hiatal hernia with thickening of the walls of the distal esophagus and proximal stomach, possible esophagitis/gastritis. 3. Mildly enlarged prostate gland. 4. Aortic atherosclerosis 07/19/2021 CT ab pelvis with contrast for abdominal pain 1. Mildly motion degraded exam throughout. 2. Distal esophageal wall thickening, suspicious for esophagitis. Proximal gastric wall thickening could represent concurrent gastritis or be artifactual in the setting of underdistention. 3. No other explanation for patient's symptoms. 4.  Aortic Atherosclerosis (ICD10-I70.0).  CBC    Component Value Date/Time   WBC 9.6 06/06/2023 1936   RBC 4.83 06/06/2023 1936   HGB 15.3 06/06/2023 1936   HCT 45.5 06/06/2023 1936   PLT 150 06/06/2023 1936   MCV 94.2 06/06/2023 1936   MCH 31.7 06/06/2023 1936   MCHC 33.6 06/06/2023 1936   RDW 13.9 06/06/2023 1936   LYMPHSABS 2.4 06/06/2023 1936   MONOABS 0.6 06/06/2023 1936   EOSABS 0.2 06/06/2023 1936   BASOSABS 0.0 06/06/2023 1936   Recent Labs    06/06/23 1936  HGB 15.3    CMP     Component Value Date/Time   NA 139 06/06/2023 1936   K 3.5 06/06/2023 1936   CL 106 06/06/2023 1936   CO2 22 06/06/2023 1936   GLUCOSE 100 (H) 06/06/2023 1936   BUN 13 06/06/2023 1936   CREATININE 0.96 06/06/2023 1936   CALCIUM 9.1 06/06/2023 1936   PROT 7.7 06/06/2023 1936   ALBUMIN 4.5 06/06/2023 1936   AST 28 06/06/2023 1936   ALT 32 06/06/2023 1936   ALKPHOS 84 06/06/2023 1936   BILITOT 0.9 06/06/2023 1936   GFRNONAA >60 06/06/2023 1936   GFRAA >60 06/08/2019 0124  Latest Ref Rng & Units 06/06/2023    7:36 PM 10/21/2022    2:39 PM 07/03/2022    2:36 AM  Hepatic Function  Total Protein 6.5 - 8.1 g/dL 7.7  7.1  7.2   Albumin 3.5 - 5.0 g/dL 4.5  3.9  4.2   AST 15 - 41 U/L 28  30  30    ALT 0 - 44 U/L 32  24  27   Alk Phosphatase 38 - 126 U/L 84  65  94   Total Bilirubin 0.3 - 1.2 mg/dL 0.9  0.4  0.9       Current  Medications:   Current Outpatient Medications (Endocrine & Metabolic):    predniSONE (DELTASONE) 20 MG tablet, Take 2 tablets (40 mg total) by mouth daily.      Current Outpatient Medications (Other):    pantoprazole (PROTONIX) 40 MG tablet, Take 1 tablet (40 mg total) by mouth daily.   tamsulosin (FLOMAX) 0.4 MG CAPS capsule, Take 1 capsule (0.4 mg total) by mouth daily.   valACYclovir (VALTREX) 1000 MG tablet, Take 1 tablet (1,000 mg total) by mouth 3 (three) times daily.  Medical History:  Past Medical History:  Diagnosis Date   Alcohol dependence (HCC)    Hematemesis    Hypertension    Allergies: No Known Allergies   Surgical History:  He  has no past surgical history on file. Family History:  His family history is not on file.  REVIEW OF SYSTEMS  : All other systems reviewed and negative except where noted in the History of Present Illness.  PHYSICAL EXAM: There were no vitals taken for this visit. General Appearance: Well nourished, in no apparent distress. Head:   Normocephalic and atraumatic. Eyes:  sclerae anicteric,conjunctive pink  Respiratory: Respiratory effort normal, BS equal bilaterally without rales, rhonchi, wheezing. Cardio: RRR with no MRGs. Peripheral pulses intact.  Abdomen: Soft,  {BlankSingle:19197::"Flat","Obese","Non-distended"} ,active bowel sounds. {actendernessAB:27319} tenderness {anatomy; site abdomen:5010}. {BlankMultiple:19196::"Without guarding","With guarding","Without rebound","With rebound"}. No masses. Rectal: {acrectalexam:27461} Musculoskeletal: Full ROM, {PSY - GAIT AND STATION:22860} gait. {With/Without:304960234} edema. Skin:  Dry and intact without significant lesions or rashes Neuro: Alert and  oriented x4;  No focal deficits. Psych:  Cooperative. Normal mood and affect.    Doree Albee, PA-C 9:48 AM

## 2023-12-03 ENCOUNTER — Ambulatory Visit: Payer: Self-pay | Admitting: Physician Assistant

## 2023-12-05 LAB — EXTERNAL GENERIC LAB PROCEDURE: COLOGUARD: NEGATIVE

## 2023-12-05 LAB — COLOGUARD: COLOGUARD: NEGATIVE

## 2024-07-12 ENCOUNTER — Emergency Department (HOSPITAL_COMMUNITY)
Admission: EM | Admit: 2024-07-12 | Discharge: 2024-07-12 | Disposition: A | Discharge status: Home / Self Care | Payer: Self-pay | Attending: Emergency Medicine | Admitting: Emergency Medicine

## 2024-07-12 ENCOUNTER — Emergency Department (HOSPITAL_COMMUNITY): Payer: Self-pay

## 2024-07-12 ENCOUNTER — Other Ambulatory Visit: Payer: Self-pay

## 2024-07-12 DIAGNOSIS — K209 Esophagitis, unspecified without bleeding: Secondary | ICD-10-CM | POA: Insufficient documentation

## 2024-07-12 DIAGNOSIS — N132 Hydronephrosis with renal and ureteral calculous obstruction: Secondary | ICD-10-CM | POA: Insufficient documentation

## 2024-07-12 DIAGNOSIS — N200 Calculus of kidney: Secondary | ICD-10-CM

## 2024-07-12 LAB — CBC
HCT: 42.4 % (ref 39.0–52.0)
Hemoglobin: 14.5 g/dL (ref 13.0–17.0)
MCH: 31.6 pg (ref 26.0–34.0)
MCHC: 34.2 g/dL (ref 30.0–36.0)
MCV: 92.4 fL (ref 80.0–100.0)
Platelets: 129 K/uL — ABNORMAL LOW (ref 150–400)
RBC: 4.59 MIL/uL (ref 4.22–5.81)
RDW: 13.7 % (ref 11.5–15.5)
WBC: 9.3 K/uL (ref 4.0–10.5)
nRBC: 0 % (ref 0.0–0.2)

## 2024-07-12 LAB — URINALYSIS, ROUTINE W REFLEX MICROSCOPIC
Bacteria, UA: NONE SEEN
Bilirubin Urine: NEGATIVE
Glucose, UA: NEGATIVE mg/dL
Ketones, ur: NEGATIVE mg/dL
Leukocytes,Ua: NEGATIVE
Nitrite: NEGATIVE
Protein, ur: NEGATIVE mg/dL
Specific Gravity, Urine: 1.017 (ref 1.005–1.030)
pH: 7 (ref 5.0–8.0)

## 2024-07-12 LAB — BASIC METABOLIC PANEL WITH GFR
Anion gap: 10 (ref 5–15)
BUN: 21 mg/dL — ABNORMAL HIGH (ref 6–20)
CO2: 21 mmol/L — ABNORMAL LOW (ref 22–32)
Calcium: 8.6 mg/dL — ABNORMAL LOW (ref 8.9–10.3)
Chloride: 103 mmol/L (ref 98–111)
Creatinine, Ser: 1.47 mg/dL — ABNORMAL HIGH (ref 0.61–1.24)
GFR, Estimated: 56 mL/min — ABNORMAL LOW (ref >60)
Glucose, Bld: 179 mg/dL — ABNORMAL HIGH (ref 70–99)
Potassium: 3.6 mmol/L (ref 3.5–5.1)
Sodium: 134 mmol/L — ABNORMAL LOW (ref 135–145)

## 2024-07-12 LAB — I-STAT CHEM 8, ED
BUN: 24 mg/dL — ABNORMAL HIGH (ref 6–20)
Calcium, Ion: 1.05 mmol/L — ABNORMAL LOW (ref 1.15–1.40)
Chloride: 105 mmol/L (ref 98–111)
Creatinine, Ser: 1.4 mg/dL — ABNORMAL HIGH (ref 0.61–1.24)
Glucose, Bld: 185 mg/dL — ABNORMAL HIGH (ref 70–99)
HCT: 43 % (ref 39.0–52.0)
Hemoglobin: 14.6 g/dL (ref 13.0–17.0)
Potassium: 3.6 mmol/L (ref 3.5–5.1)
Sodium: 137 mmol/L (ref 135–145)
TCO2: 21 mmol/L — ABNORMAL LOW (ref 22–32)

## 2024-07-12 MED ORDER — ONDANSETRON HCL 4 MG/2ML IJ SOLN
4.0000 mg | Freq: Once | INTRAMUSCULAR | Status: AC
  Administered 2024-07-12: 4 mg via INTRAVENOUS
  Filled 2024-07-12: qty 2

## 2024-07-12 MED ORDER — OMEPRAZOLE 20 MG PO CPDR: 20.0000 mg | DELAYED_RELEASE_CAPSULE | Freq: Every day | ORAL | 0 refills | Status: AC

## 2024-07-12 MED ORDER — HYDROMORPHONE HCL 1 MG/ML IJ SOLN
1.0000 mg | Freq: Once | INTRAMUSCULAR | Status: AC
  Administered 2024-07-12: 1 mg via INTRAVENOUS
  Filled 2024-07-12: qty 1

## 2024-07-12 MED ORDER — OXYCODONE-ACETAMINOPHEN 5-325 MG PO TABS: 1.0000 | ORAL_TABLET | Freq: Four times a day (QID) | ORAL | 0 refills | Status: AC | PRN

## 2024-07-12 MED ORDER — TAMSULOSIN HCL 0.4 MG PO CAPS
0.4000 mg | ORAL_CAPSULE | Freq: Two times a day (BID) | ORAL | 0 refills | Status: AC
Start: 2024-07-12 — End: ?

## 2024-07-12 MED ORDER — LACTATED RINGERS IV BOLUS
1000.0000 mL | Freq: Once | INTRAVENOUS | Status: AC
  Administered 2024-07-12: 1000 mL via INTRAVENOUS

## 2024-07-12 NOTE — ED Triage Notes (Signed)
 Spanish speaker pt c/o left side abd pain radiating to his lower back with some discomfort with urination. Pt denies any blood on the urine. States he took some Aleve at home for pain and pain came down from 10 to 4.

## 2024-07-12 NOTE — ED Provider Notes (Signed)
 [facility] Emergency Department Provider Note MRN:  [id number]  Arrival date & time: 07/12/24     Chief Complaint   Flank Pain and Abdominal Pain   History of Present Illness   Walter Daniel is a 53 y.o. year-old male presents to the ED with chief complaint of left-sided abdominal pain.  States that the pain radiates into his low back.  He reports some associated dysuria.  He denies seeing any blood in his urine.  States that he tried taking some Aleve and the pain improved from a 10 to a 4.  Denies any fevers or chills.  Denies nausea or vomiting.SABRA  He states that he has had the symptoms before, but never found out what the cause was.  History provided by patient.   Review of Systems  Pertinent positive and negative review of systems noted in HPI.    Physical Exam   Vitals:   07/12/24 0440 07/12/24 0530  BP: (!) 174/117 (!) 166/119  Pulse: 64 62  Resp:  18  Temp: 97.9 F (36.6 C)   SpO2: 99% 95%    CONSTITUTIONAL:  non toxic-appearing, NAD NEURO:  Alert and oriented x 3, CN 3-12 grossly intact EYES:  eyes equal and reactive ENT/NECK:  Supple, no stridor  CARDIO:  normal rate, regular rhythm, appears well-perfused  PULM:  No respiratory distress, CTAB GI/GU:  non-distended, no focal abdominal tenderness MSK/SPINE:  No gross deformities, no edema, moves all extremities  SKIN:  no rash, atraumatic   *Additional and/or pertinent findings included in MDM below  Diagnostic and Interventional Summary    EKG Interpretation Date/Time:    Ventricular Rate:    PR Interval:    QRS Duration:    QT Interval:    QTC Calculation:   R Axis:      Text Interpretation:         Labs Reviewed  URINALYSIS, ROUTINE W REFLEX MICROSCOPIC - Abnormal; Notable for the following components:      Result Value   Hgb urine dipstick SMALL (*)    All other components within normal limits  BASIC METABOLIC PANEL WITH GFR - Abnormal; Notable for  the following components:   Sodium 134 (*)    CO2 21 (*)    Glucose, Bld 179 (*)    BUN 21 (*)    Creatinine, Ser 1.47 (*)    Calcium  8.6 (*)    GFR, Estimated 56 (*)    All other components within normal limits  CBC - Abnormal; Notable for the following components:   Platelets 129 (*)    All other components within normal limits  I-STAT CHEM 8, ED - Abnormal; Notable for the following components:   BUN 24 (*)    Creatinine, Ser 1.40 (*)    Glucose, Bld 185 (*)    Calcium , Ion 1.05 (*)    TCO2 21 (*)    All other components within normal limits    CT Renal Stone Study  Final Result      Medications  HYDROmorphone  (DILAUDID ) injection 1 mg (1 mg Intravenous Given 07/12/24 0526)  ondansetron  (ZOFRAN ) injection 4 mg (4 mg Intravenous Given 07/12/24 0526)  lactated ringers  bolus 1,000 mL (1,000 mLs Intravenous New Bag/Given 07/12/24 0524)     Procedures  /  Critical Care Procedures  ED Course and Medical Decision Making  I have reviewed the triage vital signs, the nursing notes, and pertinent available records from the EMR.  Social Determinants Affecting Complexity  of Care: Patient has no clinically significant social determinants affecting this chief complaint..   ED Course:    Medical Decision Making Patient here with generalized abdominal pain.  Has had some bloating and constipation.  States that he has also had left flank pain and some dysuria.  Has history of kidney stones.  Labs ordered in triage are notable for mildly increased creatinine.  Will give him a liter of fluid.  No significant leukocytosis or anemia.  Urinalysis positive for some hemoglobin.  CT shows a large left-sided ureteral stone.  I suspect this is the cause of the patient's back pain and flank pain.  Will treat with Percocet and Flomax  and have him follow-up with urology.  CT scan also shows evidence of esophagitis.  Will prescribe PPI.  I have discussed all of these results with the patient and have  encouraged him to follow-up with PCP, GI, and urology.  Patient understands and agrees the plan.  He appears stable and ready for discharge.  Amount and/or Complexity of Data Reviewed Labs: ordered.  Risk Prescription drug management.         Consultants: No consultations were needed in caring for this patient.   Treatment and Plan: I considered admission due to patient's initial presentation, but after considering the examination and diagnostic results, patient will not require admission and can be discharged with outpatient follow-up.    Final Clinical Impressions(s) / ED Diagnoses     ICD-10-CM   1. Kidney stone  N20.0     2. Esophagitis  K20.90       ED Discharge Orders          Ordered    oxyCODONE -acetaminophen  (PERCOCET) 5-325 MG tablet  Every 6 hours PRN        07/12/24 0611    tamsulosin  (FLOMAX ) 0.4 MG CAPS capsule  2 times daily        07/12/24 0611    omeprazole  (PRILOSEC) 20 MG capsule  Daily        07/12/24 9388              Discharge Instructions Discussed with and Provided to Patient:   Discharge Instructions   None      Vicky Charleston, PA-C 07/12/24 9386    Carita Senior, MD 07/12/24 [phone]

## 2024-09-07 ENCOUNTER — Encounter: Payer: Self-pay | Admitting: Gastroenterology

## 2024-10-26 ENCOUNTER — Ambulatory Visit: Payer: Self-pay | Admitting: Gastroenterology

## 2024-10-26 NOTE — Progress Notes (Deleted)
 SABRA
